# Patient Record
Sex: Female | Born: 1965 | Race: Asian | Hispanic: No | Marital: Married | Smoking: Never smoker
Health system: Southern US, Community
[De-identification: ages and names within clinical notes are randomized; demographics above are authoritative.]

## PROBLEM LIST (undated history)

## (undated) DIAGNOSIS — Z124 Encounter for screening for malignant neoplasm of cervix: Secondary | ICD-10-CM

## (undated) DIAGNOSIS — B019 Varicella without complication: Secondary | ICD-10-CM

## (undated) DIAGNOSIS — E782 Mixed hyperlipidemia: Secondary | ICD-10-CM

## (undated) DIAGNOSIS — Z0000 Encounter for general adult medical examination without abnormal findings: Secondary | ICD-10-CM

## (undated) HISTORY — DX: Encounter for screening for malignant neoplasm of cervix: Z12.4

## (undated) HISTORY — DX: Mixed hyperlipidemia: E78.2

## (undated) HISTORY — DX: Encounter for general adult medical examination without abnormal findings: Z00.00

## (undated) HISTORY — DX: Varicella without complication: B01.9

---

## 2009-02-01 LAB — CONVERTED CEMR LAB

## 2010-09-12 ENCOUNTER — Ambulatory Visit: Payer: Self-pay | Admitting: Family Medicine

## 2010-09-12 DIAGNOSIS — L659 Nonscarring hair loss, unspecified: Secondary | ICD-10-CM | POA: Insufficient documentation

## 2010-09-12 DIAGNOSIS — L708 Other acne: Secondary | ICD-10-CM | POA: Insufficient documentation

## 2010-11-03 LAB — HM MAMMOGRAPHY

## 2010-12-03 NOTE — Assessment & Plan Note (Signed)
Summary: NEW PT EST // RS   Vital Signs:  Patient profile:   45 year old female Menstrual status:  regular LMP:     08/30/2010 Height:      60.25 inches (153.04 cm) Weight:      108 pounds (49.09 kg) BMI:     20.99 O2 Sat:      97 % on Room air Temp:     97.9 degrees F (36.61 degrees C) oral Pulse rate:   74 / minute BP sitting:   108 / 72  (left arm) Cuff size:   regular  Vitals Entered By: Josph Macho RMA (September 12, 2010 10:56 AM)  O2 Flow:  Room air CC: Establish new patient/ CF Is Patient Diabetic? No LMP (date): 08/30/2010     Menstrual Status regular Enter LMP: 08/30/2010 Last PAP Result historical   History of Present Illness: a 45 year old Asian female in today for a new patient appointment. She is generally in good health and has only a few concerns. One is some intermittent hair loss. She first noticed it several years ago but it resolved only to recur a couple months ago. She denies any rash, pruritus or scaling. It has episodes of increased hair loss. She also has a long history of acne has been seen by dermatology previously taken Retin-A which resolved acne but it has recurred. It is only on her face not throughout her body is cystic in nature and she would like to proceed with further treatment once again. She is denying recent illness, fevers, chills, chest pain, palpitations, shortness of breath, fevers, chills, GI or GU complaints. She has no joint concerns, vision or hearing concerns or any trouble with sleeping or further illness  Preventive Screening-Counseling & Management  Alcohol-Tobacco     Smoking Status: never  Caffeine-Diet-Exercise     Does Patient Exercise: yes      Drug Use:  no.    Current Medications (verified): 1)  None  Allergies (verified): No Known Drug Allergies  Past History:  Past Surgical History: Caesarean section x 2  Family History: Father: 34, A&W Mother: 37, A&W Siblings:  Brother: 70, A&W Sister: 32,  A&W MGM: deceased unknown cause MGF: deceased in mid 44s, old age  PGM: deceased unknown cause PGF: deceased in mid 11s, old age Children: Son: 37, A&W Son: 46, A&W  Social History: Occupation: Arts development officer Married Never Smoked Alcohol use-no Drug use-no Regular exercise-yes, walking No dietary restrictions Smoking Status:  never Occupation:  employed Drug Use:  no Does Patient Exercise:  yes  Review of Systems  The patient denies anorexia, fever, weight loss, weight gain, vision loss, decreased hearing, hoarseness, chest pain, syncope, dyspnea on exertion, peripheral edema, prolonged cough, headaches, hemoptysis, abdominal pain, melena, hematochezia, severe indigestion/heartburn, hematuria, incontinence, muscle weakness, suspicious skin lesions, transient blindness, difficulty walking, depression, unusual weight change, abnormal bleeding, and enlarged lymph nodes.    Physical Exam  General:  Well-developed,well-nourished,in no acute distress; alert,appropriate and cooperative throughout examination Head:  Normocephalic and atraumatic without obvious abnormalities. No apparent alopecia or balding. Eyes:  No corneal or conjunctival inflammation noted. EOMI. Perrla. Funduscopic exam benign, without hemorrhages, exudates or papilledema. Vision grossly normal. Ears:  External ear exam shows no significant lesions or deformities.  Otoscopic examination reveals clear canals, tympanic membranes are intact bilaterally without bulging, retraction, inflammation or discharge. Hearing is grossly normal bilaterally. Nose:  External nasal examination shows no deformity or inflammation. Nasal mucosa are pink and moist without lesions or exudates.  Mouth:  Oral mucosa and oropharynx without lesions or exudates.  Teeth in good repair. Neck:  No deformities, masses, or tenderness noted. Lungs:  Normal respiratory effort, chest expands symmetrically. Lungs are clear to auscultation, no crackles or  wheezes. Heart:  Normal rate and regular rhythm. S1 and S2 normal without gallop, murmur, click, rub or other extra sounds. Abdomen:  Bowel sounds positive,abdomen soft and non-tender without masses, organomegaly or hernias noted. Msk:  No deformity or scoliosis noted of thoracic or lumbar spine.   Pulses:  R and L carotid,radial,femoral,dorsalis pedis and posterior tibial pulses are full and equal bilaterally Extremities:  No clubbing, cyanosis, edema, or deformity noted with normal full range of motion of all joints.   Neurologic:  No cranial nerve deficits noted. Station and gait are normal. Plantar reflexes are down-going bilaterally. DTRs are symmetrical throughout. Sensory, motor and coordinative functions appear intact. Cervical Nodes:  No lymphadenopathy noted Psych:  Cognition and judgment appear intact. Alert and cooperative with normal attention span and concentration. No apparent delusions, illusions, hallucinations   Impression & Recommendations:  Problem # 1:  HAIR LOSS (ICD-704.00) Check a TSH and start fish oil and MVI with minerals  Problem # 2:  CYSTIC ACNE (ICD-706.1)  Her updated medication list for this problem includes:    Benzaclin 1-5 % Gel (Clindamycin phos-benzoyl perox) .Marland Kitchen... Apply to lesions topically two times a day or as tolerated And advised Cetaphil soap and Witch Hazel astringent if no results will refer back to Dermatology  Problem # 3:  Screening Cervical Cancer (ICD-V76.2) She believes it has been over a year since last pap and she has never done a mammogram will have her sign a release of records for old MD and return in 1-2 mn after fasting labs to review these, do breast exam and pap smear  Complete Medication List: 1)  Benzaclin 1-5 % Gel (Clindamycin phos-benzoyl perox) .... Apply to lesions topically two times a day or as tolerated 2)  Fish Oil Concentrate 1000 Mg Caps (Omega-3 fatty acids) .Marland Kitchen.. 1 cap daily 3)  Cetaphil Antibacterial 0.3 % Bar  (Triclosan) .... Wash face two times a day  Patient Instructions: 1)  Please schedule a follow-up appointment in 1 to 2  months for gyn exam. For labs code V70.0 2)  BMP prior to visit, ICD-9:  3)  Hepatic Panel prior to visit ICD-9:  4)  Lipid panel prior to visit ICD-9 :  5)  TSH prior to visit ICD-9 :  6)  CBC w/ Diff prior to visit ICD-9  7)  Sign release of records for Doctor in [place] when you find the contact infor 8)  For hair start 1 fish oil cap daily and a Multivitamin with trace minerals every other day 9)  For skin wash twice daily with Cetaphil Acne Soap, then cleanse area with Witch Hazel Astringent on cotton pad and apply Benzaclin as tolerated if too drying use as much as tolerated Prescriptions: BENZACLIN 1-5 % GEL (CLINDAMYCIN PHOS-BENZOYL PEROX) apply to lesions topically two times a day or as tolerated  #35gm x 2   Entered and Authorized by:   Danise Edge MD   Signed by:   Danise Edge MD on 09/12/2010   Method used:   Print then Give to Patient   RxID:   [id number]    Orders Added: 1)  New Patient Level III [14782]    Preventive Care Screening  Pap Smear:    Date:  02/01/2009  Results:  historical

## 2011-01-03 ENCOUNTER — Telehealth (INDEPENDENT_AMBULATORY_CARE_PROVIDER_SITE_OTHER): Payer: Self-pay | Admitting: *Deleted

## 2011-01-09 NOTE — Progress Notes (Signed)
Summary: Lab work  Phone Note Call from Patient Call back at [phone]   Caller: Patient Call For: Danise Edge MD Summary of Call: Patient had office visit in November 2011 now she is wanting lab work done. Initial call taken by: Georga Bora,  January 03, 2011 11:06 AM  Follow-up for Phone Call        OK to order annual labs flp, tsh, cbc, renal and hepatic and then she can come in to review them, thanks Follow-up by: Danise Edge MD,  January 03, 2011 11:44 AM  Additional Follow-up for Phone Call Additional follow up Details #1::        Pt wants [place] to call her when lab is available at [facility]. Will put a copy of this in file to return call. Additional Follow-up by: Josph Macho RMA,  January 03, 2011 2:01 PM

## 2011-08-06 ENCOUNTER — Other Ambulatory Visit: Payer: Self-pay | Admitting: Family Medicine

## 2011-08-06 DIAGNOSIS — Z1231 Encounter for screening mammogram for malignant neoplasm of breast: Secondary | ICD-10-CM

## 2011-08-25 ENCOUNTER — Ambulatory Visit
Admission: RE | Admit: 2011-08-25 | Discharge: 2011-08-25 | Disposition: A | Discharge status: Home / Self Care | Payer: BC Managed Care – PPO | Source: Ambulatory Visit | Attending: Family Medicine | Admitting: Family Medicine

## 2011-08-25 DIAGNOSIS — Z1231 Encounter for screening mammogram for malignant neoplasm of breast: Secondary | ICD-10-CM

## 2012-03-31 ENCOUNTER — Other Ambulatory Visit (HOSPITAL_COMMUNITY)
Admission: RE | Admit: 2012-03-31 | Discharge: 2012-03-31 | Disposition: A | Discharge status: Home / Self Care | Payer: BC Managed Care – PPO | Source: Ambulatory Visit | Attending: Family Medicine | Admitting: Family Medicine

## 2012-03-31 ENCOUNTER — Ambulatory Visit (INDEPENDENT_AMBULATORY_CARE_PROVIDER_SITE_OTHER): Payer: BC Managed Care – PPO | Admitting: Family Medicine

## 2012-03-31 ENCOUNTER — Encounter: Payer: Self-pay | Admitting: Family Medicine

## 2012-03-31 DIAGNOSIS — L659 Nonscarring hair loss, unspecified: Secondary | ICD-10-CM

## 2012-03-31 DIAGNOSIS — Z01419 Encounter for gynecological examination (general) (routine) without abnormal findings: Secondary | ICD-10-CM | POA: Insufficient documentation

## 2012-03-31 DIAGNOSIS — L708 Other acne: Secondary | ICD-10-CM

## 2012-03-31 DIAGNOSIS — Z124 Encounter for screening for malignant neoplasm of cervix: Secondary | ICD-10-CM

## 2012-03-31 DIAGNOSIS — Z0000 Encounter for general adult medical examination without abnormal findings: Secondary | ICD-10-CM

## 2012-03-31 HISTORY — DX: Encounter for general adult medical examination without abnormal findings: Z00.00

## 2012-03-31 HISTORY — DX: Encounter for screening for malignant neoplasm of cervix: Z12.4

## 2012-03-31 LAB — RENAL FUNCTION PANEL
Calcium: 8.9 mg/dL (ref 8.4–10.5)
Creatinine, Ser: 0.6 mg/dL (ref 0.4–1.2)
GFR: 122.66 mL/min (ref >60.00)
Glucose, Bld: 71 mg/dL (ref 70–99)
Potassium: 3.4 mEq/L — ABNORMAL LOW (ref 3.5–5.1)
Sodium: 140 mEq/L (ref 135–145)

## 2012-03-31 LAB — LIPID PANEL
Cholesterol: 192 mg/dL (ref 0–200)
HDL: 68.5 mg/dL (ref >39.00)
LDL Cholesterol: 112 mg/dL — ABNORMAL HIGH (ref 0–99)
Total CHOL/HDL Ratio: 3
Triglycerides: 57 mg/dL (ref 0.0–149.0)
VLDL: 11.4 mg/dL (ref 0.0–40.0)

## 2012-03-31 LAB — CBC
HCT: 42.1 % (ref 36.0–46.0)
MCV: 91.5 fl (ref 78.0–100.0)
RBC: 4.6 Mil/uL (ref 3.87–5.11)
RDW: 13.8 % (ref 11.5–14.6)
WBC: 7.7 10*3/uL (ref 4.5–10.5)

## 2012-03-31 LAB — HEPATIC FUNCTION PANEL
ALT: 14 U/L (ref 0–35)
Bilirubin, Direct: 0 mg/dL (ref 0.0–0.3)
Total Bilirubin: 0.5 mg/dL (ref 0.3–1.2)

## 2012-03-31 LAB — TSH: TSH: 1.54 u[IU]/mL (ref 0.35–5.50)

## 2012-03-31 NOTE — Assessment & Plan Note (Signed)
Encouraged her to continue a Biotin supplement and to add  MegaRed cap daily. Will check a TSH

## 2012-03-31 NOTE — Progress Notes (Signed)
Patient ID: Signa Cheek, female   DOB: 06/20/1966, 46 y.o.   MRN: [id number] Lincy Belles [id number] October 06, 1966 03/31/2012      Progress Note New Patient  Subjective  Chief Complaint  Chief Complaint  Patient presents with  . Annual Exam    hasn't been seen since 09-12-10    HPI  Patient is a 46 year old Asian female in today for annual exam. She was seen once 2 years ago and has been doing very well so has not been since. She denies any recent illness. She denies any chest pain, palpitations, shortness of breath, GI or GU complaints. Her only complaint today really is hair loss. She's has been going on for years. She takes biotin infrequently. She denies any itching or scaly scalp. She denies any other concerns at today's visit. She struggled with cystic acne while she lived in [place] and even used Accutane without benefit. Since moving to [place] her skin has cleared up and she offers no complaints  Past Medical History  Diagnosis Date  . Chicken pox as a child  . Cervical cancer screening 03/31/2012  . Preventative health care 03/31/2012    Past Surgical History  Procedure Date  . Cesarean section 1995 and 1997    X 2    History reviewed. No pertinent family history.  History   Social History  . Marital Status: Married    Spouse Name: N/A    Number of Children: N/A  . Years of Education: N/A   Occupational History  . Not on file.   Social History Main Topics  . Smoking status: Never Smoker   . Smokeless tobacco: Never Used  . Alcohol Use: No  . Drug Use: No  . Sexually Active: Not Currently   Other Topics Concern  . Not on file   Social History Narrative  . No narrative on file    No current outpatient prescriptions on file prior to visit.    No Known Allergies  Review of Systems  Review of Systems  Constitutional: Negative for fever, chills and malaise/fatigue.  HENT: Negative for hearing loss, nosebleeds and congestion.   Eyes: Negative for  discharge.  Respiratory: Negative for cough, sputum production, shortness of breath and wheezing.   Cardiovascular: Negative for chest pain, palpitations and leg swelling.  Gastrointestinal: Negative for heartburn, nausea, vomiting, abdominal pain, diarrhea, constipation and blood in stool.  Genitourinary: Negative for dysuria, urgency, frequency and hematuria.  Musculoskeletal: Negative for myalgias, back pain and falls.  Skin: Negative for rash.  Neurological: Negative for dizziness, tremors, sensory change, focal weakness, loss of consciousness, weakness and headaches.  Endo/Heme/Allergies: Negative for polydipsia. Does not bruise/bleed easily.  Psychiatric/Behavioral: Negative for depression and suicidal ideas. The patient is not nervous/anxious and does not have insomnia.     Objective  BP 105/71  Pulse 68  Temp(Src) 99.8 F (37.7 C) (Temporal)  Ht 5' 0.25" (1.53 m)  Wt 111 lb 12.8 oz (50.712 kg)  BMI 21.65 kg/m2  SpO2 98%  LMP 03/12/2012  Physical Exam  Physical Exam  Constitutional: She is oriented to person, place, and time and well-developed, well-nourished, and in no distress. No distress.  HENT:  Head: Normocephalic and atraumatic.  Right Ear: External ear normal.  Left Ear: External ear normal.  Nose: Nose normal.  Mouth/Throat: Oropharynx is clear and moist. No oropharyngeal exudate.  Eyes: Conjunctivae are normal. Pupils are equal, round, and reactive to light. Right eye exhibits no discharge. Left eye exhibits no discharge. No scleral  icterus.  Neck: Normal range of motion. Neck supple. No thyromegaly present.  Cardiovascular: Normal rate, regular rhythm, normal heart sounds and intact distal pulses.   No murmur heard. Pulmonary/Chest: Effort normal and breath sounds normal. No respiratory distress. She has no wheezes. She has no rales.  Abdominal: Soft. Bowel sounds are normal. She exhibits no distension and no mass. There is no tenderness.  Genitourinary:  Vagina normal, uterus normal, cervix normal, right adnexa normal and left adnexa normal. No vaginal discharge found.       Breast exam normal, no discharge, lesions, skin changes or discharge  Musculoskeletal: Normal range of motion. She exhibits no edema and no tenderness.  Lymphadenopathy:    She has no cervical adenopathy.  Neurological: She is alert and oriented to person, place, and time. She has normal reflexes. No cranial nerve deficit. Coordination normal.  Skin: Skin is warm and dry. No rash noted. She is not diaphoretic.  Psychiatric: Mood, memory and affect normal.       Assessment & Plan  Cervical cancer screening Pap taken today, no concerns on exam. Repeat every 2-3 years or as needed. Encouraged MGM every 1-2 years.   HAIR LOSS Encouraged her to continue a Biotin supplement and to add  MegaRed cap daily. Will check a TSH   CYSTIC ACNE Previously has used products as strong as Accutane while living in [place]. Since moving to [place] she has not used any products and her skin has cleared up nicely  Preventative health care Encouraged booster of Tdap. She believes she last had a Td about 10 years ago. Declines for today but will notify us if she wishes to proceed. Discussed flu shots but so far she does not take them and chooses not to. She may call and come in in fall if she changes her mind. Encouraged regular seat belt use. Check fasting labs today. She does not believe she has done any lab work for roughly 5 years

## 2012-03-31 NOTE — Assessment & Plan Note (Addendum)
Encouraged booster of Tdap. She believes she last had a Td about 10 years ago. Declines for today but will notify us if she wishes to proceed. Discussed flu shots but so far she does not take them and chooses not to. She may call and come in in fall if she changes her mind. Encouraged regular seat belt use. Check fasting labs today. She does not believe she has done any lab work for roughly 5 years

## 2012-03-31 NOTE — Patient Instructions (Signed)
Preventive Care for Adults, Female A healthy lifestyle and preventive care can promote health and wellness. Preventive health guidelines for women include the following key practices.  A routine yearly physical is a good way to check with your caregiver about your health and preventive screening. It is a chance to share any concerns and updates on your health, and to receive a thorough exam.   Visit your dentist for a routine exam and preventive care every 6 months. Brush your teeth twice a day and floss once a day. Good oral hygiene prevents tooth decay and gum disease.   The frequency of eye exams is based on your age, health, family medical history, use of contact lenses, and other factors. Follow your caregiver's recommendations for frequency of eye exams.   Eat a healthy diet. Foods like vegetables, fruits, whole grains, low-fat dairy products, and lean protein foods contain the nutrients you need without too many calories. Decrease your intake of foods high in solid fats, added sugars, and salt. Eat the right amount of calories for you.Get information about a proper diet from your caregiver, if necessary.   Regular physical exercise is one of the most important things you can do for your health. Most adults should get at least 150 minutes of moderate-intensity exercise (any activity that increases your heart rate and causes you to sweat) each week. In addition, most adults need muscle-strengthening exercises on 2 or more days a week.   Maintain a healthy weight. The body mass index (BMI) is a screening tool to identify possible weight problems. It provides an estimate of body fat based on height and weight. Your caregiver can help determine your BMI, and can help you achieve or maintain a healthy weight.For adults 20 years and older:   A BMI below 18.5 is considered underweight.   A BMI of 18.5 to 24.9 is normal.   A BMI of 25 to 29.9 is considered overweight.   A BMI of 30 and above is  considered obese.   Maintain normal blood lipids and cholesterol levels by exercising and minimizing your intake of saturated fat. Eat a balanced diet with plenty of fruit and vegetables. Blood tests for lipids and cholesterol should begin at age 20 and be repeated every 5 years. If your lipid or cholesterol levels are high, you are over 50, or you are at high risk for heart disease, you may need your cholesterol levels checked more frequently.Ongoing high lipid and cholesterol levels should be treated with medicines if diet and exercise are not effective.   If you smoke, find out from your caregiver how to quit. If you do not use tobacco, do not start.   If you are pregnant, do not drink alcohol. If you are breastfeeding, be very cautious about drinking alcohol. If you are not pregnant and choose to drink alcohol, do not exceed 1 drink per day. One drink is considered to be 12 ounces (355 mL) of beer, 5 ounces (148 mL) of wine, or 1.5 ounces (44 mL) of liquor.   Avoid use of street drugs. Do not share needles with anyone. Ask for help if you need support or instructions about stopping the use of drugs.   High blood pressure causes heart disease and increases the risk of stroke. Your blood pressure should be checked at least every 1 to 2 years. Ongoing high blood pressure should be treated with medicines if weight loss and exercise are not effective.   If you are 55 to 44   years old, ask your caregiver if you should take aspirin to prevent strokes.   Diabetes screening involves taking a blood sample to check your fasting blood sugar level. This should be done once every 3 years, after age 45, if you are within normal weight and without risk factors for diabetes. Testing should be considered at a younger age or be carried out more frequently if you are overweight and have at least 1 risk factor for diabetes.   Breast cancer screening is essential preventive care for women. You should practice "breast  self-awareness." This means understanding the normal appearance and feel of your breasts and may include breast self-examination. Any changes detected, no matter how small, should be reported to a caregiver. Women in their 20s and 30s should have a clinical breast exam (CBE) by a caregiver as part of a regular health exam every 1 to 3 years. After age 40, women should have a CBE every year. Starting at age 40, women should consider having a mammography (breast X-ray test) every year. Women who have a family history of breast cancer should talk to their caregiver about genetic screening. Women at a high risk of breast cancer should talk to their caregivers about having magnetic resonance imaging (MRI) and a mammography every year.   The Pap test is a screening test for cervical cancer. A Pap test can show cell changes on the cervix that might become cervical cancer if left untreated. A Pap test is a procedure in which cells are obtained and examined from the lower end of the uterus (cervix).   Women should have a Pap test starting at age 21.   Between ages 21 and 29, Pap tests should be repeated every 2 years.   Beginning at age 30, you should have a Pap test every 3 years as long as the past 3 Pap tests have been normal.   Some women have medical problems that increase the chance of getting cervical cancer. Talk to your caregiver about these problems. It is especially important to talk to your caregiver if a new problem develops soon after your last Pap test. In these cases, your caregiver may recommend more frequent screening and Pap tests.   The above recommendations are the same for women who have or have not gotten the vaccine for human papillomavirus (HPV).   If you had a hysterectomy for a problem that was not cancer or a condition that could lead to cancer, then you no longer need Pap tests. Even if you no longer need a Pap test, a regular exam is a good idea to make sure no other problems are  starting.   If you are between ages 65 and 70, and you have had normal Pap tests going back 10 years, you no longer need Pap tests. Even if you no longer need a Pap test, a regular exam is a good idea to make sure no other problems are starting.   If you have had past treatment for cervical cancer or a condition that could lead to cancer, you need Pap tests and screening for cancer for at least 20 years after your treatment.   If Pap tests have been discontinued, risk factors (such as a new sexual partner) need to be reassessed to determine if screening should be resumed.   The HPV test is an additional test that may be used for cervical cancer screening. The HPV test looks for the virus that can cause the cell changes on the cervix.   The cells collected during the Pap test can be tested for HPV. The HPV test could be used to screen women aged 30 years and older, and should be used in women of any age who have unclear Pap test results. After the age of 30, women should have HPV testing at the same frequency as a Pap test.   Colorectal cancer can be detected and often prevented. Most routine colorectal cancer screening begins at the age of 50 and continues through age 75. However, your caregiver may recommend screening at an earlier age if you have risk factors for colon cancer. On a yearly basis, your caregiver may provide home test kits to check for hidden blood in the stool. Use of a small camera at the end of a tube, to directly examine the colon (sigmoidoscopy or colonoscopy), can detect the earliest forms of colorectal cancer. Talk to your caregiver about this at age 50, when routine screening begins. Direct examination of the colon should be repeated every 5 to 10 years through age 75, unless early forms of pre-cancerous polyps or small growths are found.   Hepatitis C blood testing is recommended for all people born from 1945 through 1965 and any individual with known risks for hepatitis C.    Practice safe sex. Use condoms and avoid high-risk sexual practices to reduce the spread of sexually transmitted infections (STIs). STIs include gonorrhea, chlamydia, syphilis, trichomonas, herpes, HPV, and human immunodeficiency virus (HIV). Herpes, HIV, and HPV are viral illnesses that have no cure. They can result in disability, cancer, and death. Sexually active women aged 25 and younger should be checked for chlamydia. Older women with new or multiple partners should also be tested for chlamydia. Testing for other STIs is recommended if you are sexually active and at increased risk.   Osteoporosis is a disease in which the bones lose minerals and strength with aging. This can result in serious bone fractures. The risk of osteoporosis can be identified using a bone density scan. Women ages 65 and over and women at risk for fractures or osteoporosis should discuss screening with their caregivers. Ask your caregiver whether you should take a calcium supplement or vitamin D to reduce the rate of osteoporosis.   Menopause can be associated with physical symptoms and risks. Hormone replacement therapy is available to decrease symptoms and risks. You should talk to your caregiver about whether hormone replacement therapy is right for you.   Use sunscreen with sun protection factor (SPF) of 30 or more. Apply sunscreen liberally and repeatedly throughout the day. You should seek shade when your shadow is shorter than you. Protect yourself by wearing long sleeves, pants, a wide-brimmed hat, and sunglasses year round, whenever you are outdoors.   Once a month, do a whole body skin exam, using a mirror to look at the skin on your back. Notify your caregiver of new moles, moles that have irregular borders, moles that are larger than a pencil eraser, or moles that have changed in shape or color.   Stay current with required immunizations.   Influenza. You need a dose every fall (or winter). The composition of  the flu vaccine changes each year, so being vaccinated once is not enough.   Pneumococcal polysaccharide. You need 1 to 2 doses if you smoke cigarettes or if you have certain chronic medical conditions. You need 1 dose at age 65 (or older) if you have never been vaccinated.   Tetanus, diphtheria, pertussis (Tdap, Td). Get 1 dose of   Tdap vaccine if you are younger than age 65, are over 65 and have contact with an infant, are a healthcare worker, are pregnant, or simply want to be protected from whooping cough. After that, you need a Td booster dose every 10 years. Consult your caregiver if you have not had at least 3 tetanus and diphtheria-containing shots sometime in your life or have a deep or dirty wound.   HPV. You need this vaccine if you are a woman age 26 or younger. The vaccine is given in 3 doses over 6 months.   Measles, mumps, rubella (MMR). You need at least 1 dose of MMR if you were born in 1957 or later. You may also need a second dose.   Meningococcal. If you are age 19 to 21 and a first-year college student living in a residence hall, or have one of several medical conditions, you need to get vaccinated against meningococcal disease. You may also need additional booster doses.   Zoster (shingles). If you are age 60 or older, you should get this vaccine.   Varicella (chickenpox). If you have never had chickenpox or you were vaccinated but received only 1 dose, talk to your caregiver to find out if you need this vaccine.   Hepatitis A. You need this vaccine if you have a specific risk factor for hepatitis A virus infection or you simply wish to be protected from this disease. The vaccine is usually given as 2 doses, 6 to 18 months apart.   Hepatitis B. You need this vaccine if you have a specific risk factor for hepatitis B virus infection or you simply wish to be protected from this disease. The vaccine is given in 3 doses, usually over 6 months.  Preventive Services /  Frequency Ages 19 to 39  Blood pressure check.** / Every 1 to 2 years.   Lipid and cholesterol check.** / Every 5 years beginning at age 20.   Clinical breast exam.** / Every 3 years for women in their 20s and 30s.   Pap test.** / Every 2 years from ages 21 through 29. Every 3 years starting at age 30 through age 65 or 70 with a history of 3 consecutive normal Pap tests.   HPV screening.** / Every 3 years from ages 30 through ages 65 to 70 with a history of 3 consecutive normal Pap tests.   Hepatitis C blood test.** / For any individual with known risks for hepatitis C.   Skin self-exam. / Monthly.   Influenza immunization.** / Every year.   Pneumococcal polysaccharide immunization.** / 1 to 2 doses if you smoke cigarettes or if you have certain chronic medical conditions.   Tetanus, diphtheria, pertussis (Tdap, Td) immunization. / A one-time dose of Tdap vaccine. After that, you need a Td booster dose every 10 years.   HPV immunization. / 3 doses over 6 months, if you are 26 and younger.   Measles, mumps, rubella (MMR) immunization. / You need at least 1 dose of MMR if you were born in 1957 or later. You may also need a second dose.   Meningococcal immunization. / 1 dose if you are age 19 to 21 and a first-year college student living in a residence hall, or have one of several medical conditions, you need to get vaccinated against meningococcal disease. You may also need additional booster doses.   Varicella immunization.** / Consult your caregiver.   Hepatitis A immunization.** / Consult your caregiver. 2 doses, 6 to 18 months   apart.   Hepatitis B immunization.** / Consult your caregiver. 3 doses usually over 6 months.  Ages 40 to 64  Blood pressure check.** / Every 1 to 2 years.   Lipid and cholesterol check.** / Every 5 years beginning at age 20.   Clinical breast exam.** / Every year after age 40.   Mammogram.** / Every year beginning at age 40 and continuing for as  long as you are in good health. Consult with your caregiver.   Pap test.** / Every 3 years starting at age 30 through age 65 or 70 with a history of 3 consecutive normal Pap tests.   HPV screening.** / Every 3 years from ages 30 through ages 65 to 70 with a history of 3 consecutive normal Pap tests.   Fecal occult blood test (FOBT) of stool. / Every year beginning at age 50 and continuing until age 75. You may not need to do this test if you get a colonoscopy every 10 years.   Flexible sigmoidoscopy or colonoscopy.** / Every 5 years for a flexible sigmoidoscopy or every 10 years for a colonoscopy beginning at age 50 and continuing until age 75.   Hepatitis C blood test.** / For all people born from 1945 through 1965 and any individual with known risks for hepatitis C.   Skin self-exam. / Monthly.   Influenza immunization.** / Every year.   Pneumococcal polysaccharide immunization.** / 1 to 2 doses if you smoke cigarettes or if you have certain chronic medical conditions.   Tetanus, diphtheria, pertussis (Tdap, Td) immunization.** / A one-time dose of Tdap vaccine. After that, you need a Td booster dose every 10 years.   Measles, mumps, rubella (MMR) immunization. / You need at least 1 dose of MMR if you were born in 1957 or later. You may also need a second dose.   Varicella immunization.** / Consult your caregiver.   Meningococcal immunization.** / Consult your caregiver.   Hepatitis A immunization.** / Consult your caregiver. 2 doses, 6 to 18 months apart.   Hepatitis B immunization.** / Consult your caregiver. 3 doses, usually over 6 months.  Ages 65 and over  Blood pressure check.** / Every 1 to 2 years.   Lipid and cholesterol check.** / Every 5 years beginning at age 20.   Clinical breast exam.** / Every year after age 40.   Mammogram.** / Every year beginning at age 40 and continuing for as long as you are in good health. Consult with your caregiver.   Pap test.** /  Every 3 years starting at age 30 through age 65 or 70 with a 3 consecutive normal Pap tests. Testing can be stopped between 65 and 70 with 3 consecutive normal Pap tests and no abnormal Pap or HPV tests in the past 10 years.   HPV screening.** / Every 3 years from ages 30 through ages 65 or 70 with a history of 3 consecutive normal Pap tests. Testing can be stopped between 65 and 70 with 3 consecutive normal Pap tests and no abnormal Pap or HPV tests in the past 10 years.   Fecal occult blood test (FOBT) of stool. / Every year beginning at age 50 and continuing until age 75. You may not need to do this test if you get a colonoscopy every 10 years.   Flexible sigmoidoscopy or colonoscopy.** / Every 5 years for a flexible sigmoidoscopy or every 10 years for a colonoscopy beginning at age 50 and continuing until age 75.   Hepatitis   C blood test.** / For all people born from 1945 through 1965 and any individual with known risks for hepatitis C.   Osteoporosis screening.** / A one-time screening for women ages 65 and over and women at risk for fractures or osteoporosis.   Skin self-exam. / Monthly.   Influenza immunization.** / Every year.   Pneumococcal polysaccharide immunization.** / 1 dose at age 65 (or older) if you have never been vaccinated.   Tetanus, diphtheria, pertussis (Tdap, Td) immunization. / A one-time dose of Tdap vaccine if you are over 65 and have contact with an infant, are a healthcare worker, or simply want to be protected from whooping cough. After that, you need a Td booster dose every 10 years.   Varicella immunization.** / Consult your caregiver.   Meningococcal immunization.** / Consult your caregiver.   Hepatitis A immunization.** / Consult your caregiver. 2 doses, 6 to 18 months apart.   Hepatitis B immunization.** / Check with your caregiver. 3 doses, usually over 6 months.  ** Family history and personal history of risk and conditions may change your caregiver's  recommendations. Document Released: 12/16/2001 Document Revised: 10/09/2011 Document Reviewed: 03/17/2011 [employer] Patient Information 2012 [employer]. 

## 2012-03-31 NOTE — Assessment & Plan Note (Addendum)
Pap taken today, no concerns on exam. Repeat every 2-3 years or as needed. Encouraged MGM every 1-2 years.

## 2012-03-31 NOTE — Assessment & Plan Note (Signed)
Previously has used products as strong as Accutane while living in [place]. Since moving to [place] she has not used any products and her skin has cleared up nicely

## 2015-04-04 ENCOUNTER — Telehealth: Payer: Self-pay | Admitting: Family Medicine

## 2015-04-04 NOTE — Telephone Encounter (Signed)
Pre Visit letter sent  °

## 2015-04-24 ENCOUNTER — Ambulatory Visit (INDEPENDENT_AMBULATORY_CARE_PROVIDER_SITE_OTHER): Payer: 59 | Admitting: Family Medicine

## 2015-04-24 ENCOUNTER — Encounter: Payer: Self-pay | Admitting: Family Medicine

## 2015-04-24 ENCOUNTER — Other Ambulatory Visit (HOSPITAL_COMMUNITY)
Admission: RE | Admit: 2015-04-24 | Discharge: 2015-04-24 | Disposition: A | Discharge status: Home / Self Care | Payer: 59 | Source: Ambulatory Visit | Attending: Family Medicine | Admitting: Family Medicine

## 2015-04-24 VITALS — BP 102/68 | HR 73 | Temp 98.7°F | Ht 60.0 in | Wt 107.1 lb

## 2015-04-24 DIAGNOSIS — E876 Hypokalemia: Secondary | ICD-10-CM

## 2015-04-24 DIAGNOSIS — Z1239 Encounter for other screening for malignant neoplasm of breast: Secondary | ICD-10-CM

## 2015-04-24 DIAGNOSIS — Z124 Encounter for screening for malignant neoplasm of cervix: Secondary | ICD-10-CM

## 2015-04-24 DIAGNOSIS — Z01419 Encounter for gynecological examination (general) (routine) without abnormal findings: Secondary | ICD-10-CM | POA: Diagnosis not present

## 2015-04-24 DIAGNOSIS — E782 Mixed hyperlipidemia: Secondary | ICD-10-CM | POA: Diagnosis not present

## 2015-04-24 DIAGNOSIS — Z0000 Encounter for general adult medical examination without abnormal findings: Secondary | ICD-10-CM

## 2015-04-24 NOTE — Assessment & Plan Note (Signed)
Patient encouraged to maintain heart healthy diet, regular exercise, adequate sleep. Consider daily probiotics. Take medications as prescribed. Given and reviewed copy of ACP documents from [place] Secretary of State and encouraged to complete and return 

## 2015-04-24 NOTE — Patient Instructions (Signed)
Preventive Care for Adults A healthy lifestyle and preventive care can promote health and wellness. Preventive health guidelines for women include the following key practices.  A routine yearly physical is a good way to check with your health care provider about your health and preventive screening. It is a chance to share any concerns and updates on your health and to receive a thorough exam.  Visit your dentist for a routine exam and preventive care every 6 months. Brush your teeth twice a day and floss once a day. Good oral hygiene prevents tooth decay and gum disease.  The frequency of eye exams is based on your age, health, family medical history, use of contact lenses, and other factors. Follow your health care provider's recommendations for frequency of eye exams.  Eat a healthy diet. Foods like vegetables, fruits, whole grains, low-fat dairy products, and lean protein foods contain the nutrients you need without too many calories. Decrease your intake of foods high in solid fats, added sugars, and salt. Eat the right amount of calories for you.Get information about a proper diet from your health care provider, if necessary.  Regular physical exercise is one of the most important things you can do for your health. Most adults should get at least 150 minutes of moderate-intensity exercise (any activity that increases your heart rate and causes you to sweat) each week. In addition, most adults need muscle-strengthening exercises on 2 or more days a week.  Maintain a healthy weight. The body mass index (BMI) is a screening tool to identify possible weight problems. It provides an estimate of body fat based on height and weight. Your health care provider can find your BMI and can help you achieve or maintain a healthy weight.For adults 20 years and older:  A BMI below 18.5 is considered underweight.  A BMI of 18.5 to 24.9 is normal.  A BMI of 25 to 29.9 is considered overweight.  A BMI of  30 and above is considered obese.  Maintain normal blood lipids and cholesterol levels by exercising and minimizing your intake of saturated fat. Eat a balanced diet with plenty of fruit and vegetables. Blood tests for lipids and cholesterol should begin at age 76 and be repeated every 5 years. If your lipid or cholesterol levels are high, you are over 50, or you are at high risk for heart disease, you may need your cholesterol levels checked more frequently.Ongoing high lipid and cholesterol levels should be treated with medicines if diet and exercise are not working.  If you smoke, find out from your health care provider how to quit. If you do not use tobacco, do not start.  Lung cancer screening is recommended for adults aged 22-80 years who are at high risk for developing lung cancer because of a history of smoking. A yearly low-dose CT scan of the lungs is recommended for people who have at least a 30-pack-year history of smoking and are a current smoker or have quit within the past 15 years. A pack year of smoking is smoking an average of 1 pack of cigarettes a day for 1 year (for example: 1 pack a day for 30 years or 2 packs a day for 15 years). Yearly screening should continue until the smoker has stopped smoking for at least 15 years. Yearly screening should be stopped for people who develop a health problem that would prevent them from having lung cancer treatment.  If you are pregnant, do not drink alcohol. If you are breastfeeding,  be very cautious about drinking alcohol. If you are not pregnant and choose to drink alcohol, do not have more than 1 drink per day. One drink is considered to be 12 ounces (355 mL) of beer, 5 ounces (148 mL) of wine, or 1.5 ounces (44 mL) of liquor.  Avoid use of street drugs. Do not share needles with anyone. Ask for help if you need support or instructions about stopping the use of drugs.  High blood pressure causes heart disease and increases the risk of  stroke. Your blood pressure should be checked at least every 1 to 2 years. Ongoing high blood pressure should be treated with medicines if weight loss and exercise do not work.  If you are 3-86 years old, ask your health care provider if you should take aspirin to prevent strokes.  Diabetes screening involves taking a blood sample to check your fasting blood sugar level. This should be done once every 3 years, after age 67, if you are within normal weight and without risk factors for diabetes. Testing should be considered at a younger age or be carried out more frequently if you are overweight and have at least 1 risk factor for diabetes.  Breast cancer screening is essential preventive care for women. You should practice "breast self-awareness." This means understanding the normal appearance and feel of your breasts and may include breast self-examination. Any changes detected, no matter how small, should be reported to a health care provider. Women in their 8s and 30s should have a clinical breast exam (CBE) by a health care provider as part of a regular health exam every 1 to 3 years. After age 70, women should have a CBE every year. Starting at age 25, women should consider having a mammogram (breast X-ray test) every year. Women who have a family history of breast cancer should talk to their health care provider about genetic screening. Women at a high risk of breast cancer should talk to their health care providers about having an MRI and a mammogram every year.  Breast cancer gene (BRCA)-related cancer risk assessment is recommended for women who have family members with BRCA-related cancers. BRCA-related cancers include breast, ovarian, tubal, and peritoneal cancers. Having family members with these cancers may be associated with an increased risk for harmful changes (mutations) in the breast cancer genes BRCA1 and BRCA2. Results of the assessment will determine the need for genetic counseling and  BRCA1 and BRCA2 testing.  Routine pelvic exams to screen for cancer are no longer recommended for nonpregnant women who are considered low risk for cancer of the pelvic organs (ovaries, uterus, and vagina) and who do not have symptoms. Ask your health care provider if a screening pelvic exam is right for you.  If you have had past treatment for cervical cancer or a condition that could lead to cancer, you need Pap tests and screening for cancer for at least 20 years after your treatment. If Pap tests have been discontinued, your risk factors (such as having a new sexual partner) need to be reassessed to determine if screening should be resumed. Some women have medical problems that increase the chance of getting cervical cancer. In these cases, your health care provider may recommend more frequent screening and Pap tests.  The HPV test is an additional test that may be used for cervical cancer screening. The HPV test looks for the virus that can cause the cell changes on the cervix. The cells collected during the Pap test can be  tested for HPV. The HPV test could be used to screen women aged 30 years and older, and should be used in women of any age who have unclear Pap test results. After the age of 30, women should have HPV testing at the same frequency as a Pap test.  Colorectal cancer can be detected and often prevented. Most routine colorectal cancer screening begins at the age of 50 years and continues through age 75 years. However, your health care provider may recommend screening at an earlier age if you have risk factors for colon cancer. On a yearly basis, your health care provider may provide home test kits to check for hidden blood in the stool. Use of a small camera at the end of a tube, to directly examine the colon (sigmoidoscopy or colonoscopy), can detect the earliest forms of colorectal cancer. Talk to your health care provider about this at age 50, when routine screening begins. Direct  exam of the colon should be repeated every 5-10 years through age 75 years, unless early forms of pre-cancerous polyps or small growths are found.  People who are at an increased risk for hepatitis B should be screened for this virus. You are considered at high risk for hepatitis B if:  You were born in a country where hepatitis B occurs often. Talk with your health care provider about which countries are considered high risk.  Your parents were born in a high-risk country and you have not received a shot to protect against hepatitis B (hepatitis B vaccine).  You have HIV or AIDS.  You use needles to inject street drugs.  You live with, or have sex with, someone who has hepatitis B.  You get hemodialysis treatment.  You take certain medicines for conditions like cancer, organ transplantation, and autoimmune conditions.  Hepatitis C blood testing is recommended for all people born from 1945 through 1965 and any individual with known risks for hepatitis C.  Practice safe sex. Use condoms and avoid high-risk sexual practices to reduce the spread of sexually transmitted infections (STIs). STIs include gonorrhea, chlamydia, syphilis, trichomonas, herpes, HPV, and human immunodeficiency virus (HIV). Herpes, HIV, and HPV are viral illnesses that have no cure. They can result in disability, cancer, and death.  You should be screened for sexually transmitted illnesses (STIs) including gonorrhea and chlamydia if:  You are sexually active and are younger than 24 years.  You are older than 24 years and your health care provider tells you that you are at risk for this type of infection.  Your sexual activity has changed since you were last screened and you are at an increased risk for chlamydia or gonorrhea. Ask your health care provider if you are at risk.  If you are at risk of being infected with HIV, it is recommended that you take a prescription medicine daily to prevent HIV infection. This is  called preexposure prophylaxis (PrEP). You are considered at risk if:  You are a heterosexual woman, are sexually active, and are at increased risk for HIV infection.  You take drugs by injection.  You are sexually active with a partner who has HIV.  Talk with your health care provider about whether you are at high risk of being infected with HIV. If you choose to begin PrEP, you should first be tested for HIV. You should then be tested every 3 months for as long as you are taking PrEP.  Osteoporosis is a disease in which the bones lose minerals and strength   with aging. This can result in serious bone fractures or breaks. The risk of osteoporosis can be identified using a bone density scan. Women ages 65 years and over and women at risk for fractures or osteoporosis should discuss screening with their health care providers. Ask your health care provider whether you should take a calcium supplement or vitamin D to reduce the rate of osteoporosis.  Menopause can be associated with physical symptoms and risks. Hormone replacement therapy is available to decrease symptoms and risks. You should talk to your health care provider about whether hormone replacement therapy is right for you.  Use sunscreen. Apply sunscreen liberally and repeatedly throughout the day. You should seek shade when your shadow is shorter than you. Protect yourself by wearing long sleeves, pants, a wide-brimmed hat, and sunglasses year round, whenever you are outdoors.  Once a month, do a whole body skin exam, using a mirror to look at the skin on your back. Tell your health care provider of new moles, moles that have irregular borders, moles that are larger than a pencil eraser, or moles that have changed in shape or color.  Stay current with required vaccines (immunizations).  Influenza vaccine. All adults should be immunized every year.  Tetanus, diphtheria, and acellular pertussis (Td, Tdap) vaccine. Pregnant women should  receive 1 dose of Tdap vaccine during each pregnancy. The dose should be obtained regardless of the length of time since the last dose. Immunization is preferred during the 27th-36th week of gestation. An adult who has not previously received Tdap or who does not know her vaccine status should receive 1 dose of Tdap. This initial dose should be followed by tetanus and diphtheria toxoids (Td) booster doses every 10 years. Adults with an unknown or incomplete history of completing a 3-dose immunization series with Td-containing vaccines should begin or complete a primary immunization series including a Tdap dose. Adults should receive a Td booster every 10 years.  Varicella vaccine. An adult without evidence of immunity to varicella should receive 2 doses or a second dose if she has previously received 1 dose. Pregnant females who do not have evidence of immunity should receive the first dose after pregnancy. This first dose should be obtained before leaving the health care facility. The second dose should be obtained 4-8 weeks after the first dose.  Human papillomavirus (HPV) vaccine. Females aged 13-26 years who have not received the vaccine previously should obtain the 3-dose series. The vaccine is not recommended for use in pregnant females. However, pregnancy testing is not needed before receiving a dose. If a female is found to be pregnant after receiving a dose, no treatment is needed. In that case, the remaining doses should be delayed until after the pregnancy. Immunization is recommended for any person with an immunocompromised condition through the age of 26 years if she did not get any or all doses earlier. During the 3-dose series, the second dose should be obtained 4-8 weeks after the first dose. The third dose should be obtained 24 weeks after the first dose and 16 weeks after the second dose.  Zoster vaccine. One dose is recommended for adults aged 60 years or older unless certain conditions are  present.  Measles, mumps, and rubella (MMR) vaccine. Adults born before 1957 generally are considered immune to measles and mumps. Adults born in 1957 or later should have 1 or more doses of MMR vaccine unless there is a contraindication to the vaccine or there is laboratory evidence of immunity to   each of the three diseases. A routine second dose of MMR vaccine should be obtained at least 28 days after the first dose for students attending postsecondary schools, health care workers, or international travelers. People who received inactivated measles vaccine or an unknown type of measles vaccine during 1963-1967 should receive 2 doses of MMR vaccine. People who received inactivated mumps vaccine or an unknown type of mumps vaccine before 1979 and are at high risk for mumps infection should consider immunization with 2 doses of MMR vaccine. For females of childbearing age, rubella immunity should be determined. If there is no evidence of immunity, females who are not pregnant should be vaccinated. If there is no evidence of immunity, females who are pregnant should delay immunization until after pregnancy. Unvaccinated health care workers born before 1957 who lack laboratory evidence of measles, mumps, or rubella immunity or laboratory confirmation of disease should consider measles and mumps immunization with 2 doses of MMR vaccine or rubella immunization with 1 dose of MMR vaccine.  Pneumococcal 13-valent conjugate (PCV13) vaccine. When indicated, a person who is uncertain of her immunization history and has no record of immunization should receive the PCV13 vaccine. An adult aged 19 years or older who has certain medical conditions and has not been previously immunized should receive 1 dose of PCV13 vaccine. This PCV13 should be followed with a dose of pneumococcal polysaccharide (PPSV23) vaccine. The PPSV23 vaccine dose should be obtained at least 8 weeks after the dose of PCV13 vaccine. An adult aged 19  years or older who has certain medical conditions and previously received 1 or more doses of PPSV23 vaccine should receive 1 dose of PCV13. The PCV13 vaccine dose should be obtained 1 or more years after the last PPSV23 vaccine dose.  Pneumococcal polysaccharide (PPSV23) vaccine. When PCV13 is also indicated, PCV13 should be obtained first. All adults aged 65 years and older should be immunized. An adult younger than age 65 years who has certain medical conditions should be immunized. Any person who resides in a nursing home or long-term care facility should be immunized. An adult smoker should be immunized. People with an immunocompromised condition and certain other conditions should receive both PCV13 and PPSV23 vaccines. People with human immunodeficiency virus (HIV) infection should be immunized as soon as possible after diagnosis. Immunization during chemotherapy or radiation therapy should be avoided. Routine use of PPSV23 vaccine is not recommended for American Indians, [place] Natives, or people younger than 65 years unless there are medical conditions that require PPSV23 vaccine. When indicated, people who have unknown immunization and have no record of immunization should receive PPSV23 vaccine. One-time revaccination 5 years after the first dose of PPSV23 is recommended for people aged 19-64 years who have chronic kidney failure, nephrotic syndrome, asplenia, or immunocompromised conditions. People who received 1-2 doses of PPSV23 before age 65 years should receive another dose of PPSV23 vaccine at age 65 years or later if at least 5 years have passed since the previous dose. Doses of PPSV23 are not needed for people immunized with PPSV23 at or after age 65 years.  Meningococcal vaccine. Adults with asplenia or persistent complement component deficiencies should receive 2 doses of quadrivalent meningococcal conjugate (MenACWY-D) vaccine. The doses should be obtained at least 2 months apart.  Microbiologists working with certain meningococcal bacteria, military recruits, people at risk during an outbreak, and people who travel to or live in countries with a high rate of meningitis should be immunized. A first-year college student up through age   21 years who is living in a residence hall should receive a dose if she did not receive a dose on or after her 16th birthday. Adults who have certain high-risk conditions should receive one or more doses of vaccine.  Hepatitis A vaccine. Adults who wish to be protected from this disease, have certain high-risk conditions, work with hepatitis A-infected animals, work in hepatitis A research labs, or travel to or work in countries with a high rate of hepatitis A should be immunized. Adults who were previously unvaccinated and who anticipate close contact with an international adoptee during the first 60 days after arrival in the [place] from a country with a high rate of hepatitis A should be immunized.  Hepatitis B vaccine. Adults who wish to be protected from this disease, have certain high-risk conditions, may be exposed to blood or other infectious body fluids, are household contacts or sex partners of hepatitis B positive people, are clients or workers in certain care facilities, or travel to or work in countries with a high rate of hepatitis B should be immunized.  Haemophilus influenzae type b (Hib) vaccine. A previously unvaccinated person with asplenia or sickle cell disease or having a scheduled splenectomy should receive 1 dose of Hib vaccine. Regardless of previous immunization, a recipient of a hematopoietic stem cell transplant should receive a 3-dose series 6-12 months after her successful transplant. Hib vaccine is not recommended for adults with HIV infection. Preventive Services / Frequency Ages 64 to 68 years  Blood pressure check.** / Every 1 to 2 years.  Lipid and cholesterol check.** / Every 5 years beginning at age  22.  Clinical breast exam.** / Every 3 years for women in their 88s and 53s.  BRCA-related cancer risk assessment.** / For women who have family members with a BRCA-related cancer (breast, ovarian, tubal, or peritoneal cancers).  Pap test.** / Every 2 years from ages 90 through 51. Every 3 years starting at age 21 through age 56 or 3 with a history of 3 consecutive normal Pap tests.  HPV screening.** / Every 3 years from ages 24 through ages 1 to 46 with a history of 3 consecutive normal Pap tests.  Hepatitis C blood test.** / For any individual with known risks for hepatitis C.  Skin self-exam. / Monthly.  Influenza vaccine. / Every year.  Tetanus, diphtheria, and acellular pertussis (Tdap, Td) vaccine.** / Consult your health care provider. Pregnant women should receive 1 dose of Tdap vaccine during each pregnancy. 1 dose of Td every 10 years.  Varicella vaccine.** / Consult your health care provider. Pregnant females who do not have evidence of immunity should receive the first dose after pregnancy.  HPV vaccine. / 3 doses over 6 months, if 72 and younger. The vaccine is not recommended for use in pregnant females. However, pregnancy testing is not needed before receiving a dose.  Measles, mumps, rubella (MMR) vaccine.** / You need at least 1 dose of MMR if you were born in 1957 or later. You may also need a 2nd dose. For females of childbearing age, rubella immunity should be determined. If there is no evidence of immunity, females who are not pregnant should be vaccinated. If there is no evidence of immunity, females who are pregnant should delay immunization until after pregnancy.  Pneumococcal 13-valent conjugate (PCV13) vaccine.** / Consult your health care provider.  Pneumococcal polysaccharide (PPSV23) vaccine.** / 1 to 2 doses if you smoke cigarettes or if you have certain conditions.  Meningococcal vaccine.** /  1 dose if you are age 19 to 21 years and a first-year college  student living in a residence hall, or have one of several medical conditions, you need to get vaccinated against meningococcal disease. You may also need additional booster doses.  Hepatitis A vaccine.** / Consult your health care provider.  Hepatitis B vaccine.** / Consult your health care provider.  Haemophilus influenzae type b (Hib) vaccine.** / Consult your health care provider. Ages 40 to 64 years  Blood pressure check.** / Every 1 to 2 years.  Lipid and cholesterol check.** / Every 5 years beginning at age 20 years.  Lung cancer screening. / Every year if you are aged 55-80 years and have a 30-pack-year history of smoking and currently smoke or have quit within the past 15 years. Yearly screening is stopped once you have quit smoking for at least 15 years or develop a health problem that would prevent you from having lung cancer treatment.  Clinical breast exam.** / Every year after age 40 years.  BRCA-related cancer risk assessment.** / For women who have family members with a BRCA-related cancer (breast, ovarian, tubal, or peritoneal cancers).  Mammogram.** / Every year beginning at age 40 years and continuing for as long as you are in good health. Consult with your health care provider.  Pap test.** / Every 3 years starting at age 30 years through age 65 or 70 years with a history of 3 consecutive normal Pap tests.  HPV screening.** / Every 3 years from ages 30 years through ages 65 to 70 years with a history of 3 consecutive normal Pap tests.  Fecal occult blood test (FOBT) of stool. / Every year beginning at age 50 years and continuing until age 75 years. You may not need to do this test if you get a colonoscopy every 10 years.  Flexible sigmoidoscopy or colonoscopy.** / Every 5 years for a flexible sigmoidoscopy or every 10 years for a colonoscopy beginning at age 50 years and continuing until age 75 years.  Hepatitis C blood test.** / For all people born from 1945 through  1965 and any individual with known risks for hepatitis C.  Skin self-exam. / Monthly.  Influenza vaccine. / Every year.  Tetanus, diphtheria, and acellular pertussis (Tdap/Td) vaccine.** / Consult your health care provider. Pregnant women should receive 1 dose of Tdap vaccine during each pregnancy. 1 dose of Td every 10 years.  Varicella vaccine.** / Consult your health care provider. Pregnant females who do not have evidence of immunity should receive the first dose after pregnancy.  Zoster vaccine.** / 1 dose for adults aged 60 years or older.  Measles, mumps, rubella (MMR) vaccine.** / You need at least 1 dose of MMR if you were born in 1957 or later. You may also need a 2nd dose. For females of childbearing age, rubella immunity should be determined. If there is no evidence of immunity, females who are not pregnant should be vaccinated. If there is no evidence of immunity, females who are pregnant should delay immunization until after pregnancy.  Pneumococcal 13-valent conjugate (PCV13) vaccine.** / Consult your health care provider.  Pneumococcal polysaccharide (PPSV23) vaccine.** / 1 to 2 doses if you smoke cigarettes or if you have certain conditions.  Meningococcal vaccine.** / Consult your health care provider.  Hepatitis A vaccine.** / Consult your health care provider.  Hepatitis B vaccine.** / Consult your health care provider.  Haemophilus influenzae type b (Hib) vaccine.** / Consult your health care provider. Ages 65   years and over  Blood pressure check.** / Every 1 to 2 years.  Lipid and cholesterol check.** / Every 5 years beginning at age 22 years.  Lung cancer screening. / Every year if you are aged 73-80 years and have a 30-pack-year history of smoking and currently smoke or have quit within the past 15 years. Yearly screening is stopped once you have quit smoking for at least 15 years or develop a health problem that would prevent you from having lung cancer  treatment.  Clinical breast exam.** / Every year after age 4 years.  BRCA-related cancer risk assessment.** / For women who have family members with a BRCA-related cancer (breast, ovarian, tubal, or peritoneal cancers).  Mammogram.** / Every year beginning at age 40 years and continuing for as long as you are in good health. Consult with your health care provider.  Pap test.** / Every 3 years starting at age 9 years through age 34 or 91 years with 3 consecutive normal Pap tests. Testing can be stopped between 65 and 70 years with 3 consecutive normal Pap tests and no abnormal Pap or HPV tests in the past 10 years.  HPV screening.** / Every 3 years from ages 57 years through ages 64 or 45 years with a history of 3 consecutive normal Pap tests. Testing can be stopped between 65 and 70 years with 3 consecutive normal Pap tests and no abnormal Pap or HPV tests in the past 10 years.  Fecal occult blood test (FOBT) of stool. / Every year beginning at age 15 years and continuing until age 17 years. You may not need to do this test if you get a colonoscopy every 10 years.  Flexible sigmoidoscopy or colonoscopy.** / Every 5 years for a flexible sigmoidoscopy or every 10 years for a colonoscopy beginning at age 86 years and continuing until age 71 years.  Hepatitis C blood test.** / For all people born from [birth year] through 1963 and any individual with known risks for hepatitis C.  Osteoporosis screening.** / A one-time screening for women ages 83 years and over and women at risk for fractures or osteoporosis.  Skin self-exam. / Monthly.  Influenza vaccine. / Every year.  Tetanus, diphtheria, and acellular pertussis (Tdap/Td) vaccine.** / 1 dose of Td every 10 years.  Varicella vaccine.** / Consult your health care provider.  Zoster vaccine.** / 1 dose for adults aged 61 years or older.  Pneumococcal 13-valent conjugate (PCV13) vaccine.** / Consult your health care provider.  Pneumococcal  polysaccharide (PPSV23) vaccine.** / 1 dose for all adults aged 28 years and older.  Meningococcal vaccine.** / Consult your health care provider.  Hepatitis A vaccine.** / Consult your health care provider.  Hepatitis B vaccine.** / Consult your health care provider.  Haemophilus influenzae type b (Hib) vaccine.** / Consult your health care provider. ** Family history and personal history of risk and conditions may change your health care provider's recommendations. Document Released: 12/16/2001 Document Revised: 03/06/2014 Document Reviewed: 03/17/2011 [facility] Patient Information 2015 [place], [place]. This information is not intended to replace advice given to you by your health care provider. Make sure you discuss any questions you have with your health care provider.

## 2015-04-24 NOTE — Assessment & Plan Note (Signed)
Pap today, no concerns on exam.  

## 2015-04-24 NOTE — Progress Notes (Signed)
Pre visit review using our clinic review tool, if applicable. No additional management support is needed unless otherwise documented below in the visit note. 

## 2015-04-25 LAB — COMPREHENSIVE METABOLIC PANEL
ALBUMIN: 4.4 g/dL (ref 3.5–5.2)
ALK PHOS: 36 U/L — AB (ref 39–117)
ALT: 11 U/L (ref 0–35)
AST: 20 U/L (ref 0–37)
BILIRUBIN TOTAL: 0.4 mg/dL (ref 0.2–1.2)
BUN: 9 mg/dL (ref 6–23)
CALCIUM: 9.2 mg/dL (ref 8.4–10.5)
CO2: 26 mEq/L (ref 19–32)
Chloride: 102 mEq/L (ref 96–112)
Creatinine, Ser: 0.68 mg/dL (ref 0.40–1.20)
GFR: 98.69 mL/min (ref >60.00)
GLUCOSE: 75 mg/dL (ref 70–99)
POTASSIUM: 3.6 meq/L (ref 3.5–5.1)
SODIUM: 135 meq/L (ref 135–145)
TOTAL PROTEIN: 7.7 g/dL (ref 6.0–8.3)

## 2015-04-25 LAB — LIPID PANEL
CHOLESTEROL: 192 mg/dL (ref 0–200)
HDL: 68.1 mg/dL (ref >39.00)
LDL Cholesterol: 109 mg/dL — ABNORMAL HIGH (ref 0–99)
NonHDL: 123.9
TRIGLYCERIDES: 77 mg/dL (ref 0.0–149.0)
Total CHOL/HDL Ratio: 3
VLDL: 15.4 mg/dL (ref 0.0–40.0)

## 2015-04-27 ENCOUNTER — Ambulatory Visit (HOSPITAL_BASED_OUTPATIENT_CLINIC_OR_DEPARTMENT_OTHER)
Admission: RE | Admit: 2015-04-27 | Discharge: 2015-04-27 | Disposition: A | Discharge status: Home / Self Care | Payer: 59 | Source: Ambulatory Visit | Attending: Family Medicine | Admitting: Family Medicine

## 2015-04-27 DIAGNOSIS — Z124 Encounter for screening for malignant neoplasm of cervix: Secondary | ICD-10-CM

## 2015-04-27 DIAGNOSIS — E782 Mixed hyperlipidemia: Secondary | ICD-10-CM

## 2015-04-27 DIAGNOSIS — Z1231 Encounter for screening mammogram for malignant neoplasm of breast: Secondary | ICD-10-CM | POA: Insufficient documentation

## 2015-04-27 DIAGNOSIS — R928 Other abnormal and inconclusive findings on diagnostic imaging of breast: Secondary | ICD-10-CM | POA: Insufficient documentation

## 2015-04-27 DIAGNOSIS — E876 Hypokalemia: Secondary | ICD-10-CM

## 2015-04-27 DIAGNOSIS — Z1239 Encounter for other screening for malignant neoplasm of breast: Secondary | ICD-10-CM

## 2015-04-27 LAB — CYTOLOGY - PAP

## 2015-05-01 ENCOUNTER — Other Ambulatory Visit: Payer: Self-pay | Admitting: Family Medicine

## 2015-05-01 DIAGNOSIS — R928 Other abnormal and inconclusive findings on diagnostic imaging of breast: Secondary | ICD-10-CM

## 2015-05-07 ENCOUNTER — Encounter: Payer: Self-pay | Admitting: Family Medicine

## 2015-05-07 DIAGNOSIS — E785 Hyperlipidemia, unspecified: Secondary | ICD-10-CM | POA: Insufficient documentation

## 2015-05-07 DIAGNOSIS — E782 Mixed hyperlipidemia: Secondary | ICD-10-CM

## 2015-05-07 HISTORY — DX: Mixed hyperlipidemia: E78.2

## 2015-05-07 NOTE — Progress Notes (Signed)
Susan Richard  [id number] 1965/12/15 05/07/2015      Progress Note-Follow Up  Subjective  Chief Complaint  Chief Complaint  Patient presents with  . Annual Exam    HPI  Patient is a 49 y.o. female in today for routine medical care. Patient is in today for annual exam. She reports feeling well. No recent illness. No acute concerns. Denies CP/palp/SOB/HA/congestion/fevers/GI or GU c/o. Taking meds as prescribed  Past Medical History  Diagnosis Date  . Chicken pox as a child  . Cervical cancer screening 03/31/2012  . Preventative health care 03/31/2012  . Hyperlipidemia, mixed 05/07/2015    Past Surgical History  Procedure Laterality Date  . Cesarean section  1995 and 1997    X 2    History reviewed. No pertinent family history.  History   Social History  . Marital Status: Married    Spouse Name: N/A  . Number of Children: N/A  . Years of Education: N/A   Occupational History  . Not on file.   Social History Main Topics  . Smoking status: Never Smoker   . Smokeless tobacco: Never Used  . Alcohol Use: No  . Drug Use: No  . Sexual Activity: Not Currently   Other Topics Concern  . Not on file   Social History Narrative    No current outpatient prescriptions on file prior to visit.   No current facility-administered medications on file prior to visit.    No Known Allergies  Review of Systems  Review of Systems  Constitutional: Negative for fever, chills and malaise/fatigue.  HENT: Negative for congestion, hearing loss and nosebleeds.   Eyes: Negative for discharge.  Respiratory: Negative for cough, sputum production, shortness of breath and wheezing.   Cardiovascular: Negative for chest pain, palpitations and leg swelling.  Gastrointestinal: Negative for heartburn, nausea, vomiting, abdominal pain, diarrhea, constipation and blood in stool.  Genitourinary: Negative for dysuria, urgency, frequency and hematuria.  Musculoskeletal: Negative for myalgias, back  pain and falls.  Skin: Negative for rash.  Neurological: Negative for dizziness, tremors, sensory change, focal weakness, loss of consciousness, weakness and headaches.  Endo/Heme/Allergies: Negative for polydipsia. Does not bruise/bleed easily.  Psychiatric/Behavioral: Negative for depression and suicidal ideas. The patient is not nervous/anxious and does not have insomnia.     Objective  BP 102/68 mmHg  Pulse 73  Temp(Src) 98.7 F (37.1 C) (Oral)  Ht 5' (1.524 m)  Wt 107 lb 2 oz (48.592 kg)  BMI 20.92 kg/m2  SpO2 99%  LMP 03/09/2015  Physical Exam  Physical Exam  Constitutional: She is oriented to person, place, and time and well-developed, well-nourished, and in no distress. No distress.  HENT:  Head: Normocephalic and atraumatic.  Right Ear: External ear normal.  Left Ear: External ear normal.  Nose: Nose normal.  Mouth/Throat: Oropharynx is clear and moist. No oropharyngeal exudate.  Eyes: Conjunctivae are normal. Pupils are equal, round, and reactive to light. Right eye exhibits no discharge. Left eye exhibits no discharge. No scleral icterus.  Neck: Normal range of motion. Neck supple. No thyromegaly present.  Cardiovascular: Normal rate, regular rhythm, normal heart sounds and intact distal pulses.   No murmur heard. Pulmonary/Chest: Effort normal and breath sounds normal. No respiratory distress. She has no wheezes. She has no rales.  Abdominal: Soft. Bowel sounds are normal. She exhibits no distension and no mass. There is no tenderness.  Genitourinary: Vagina normal, uterus normal, cervix normal, right adnexa normal and left adnexa normal. No vaginal discharge found.  Musculoskeletal: Normal range of motion. She exhibits no edema or tenderness.  Lymphadenopathy:    She has no cervical adenopathy.  Neurological: She is alert and oriented to person, place, and time. She has normal reflexes. No cranial nerve deficit. Coordination normal.  Skin: Skin is warm and dry. No  rash noted. She is not diaphoretic.  Psychiatric: Mood, memory and affect normal.    Lab Results  Component Value Date   TSH 1.54 03/31/2012   Lab Results  Component Value Date   WBC 7.7 03/31/2012   HGB 13.9 03/31/2012   HCT 42.1 03/31/2012   MCV 91.5 03/31/2012   PLT 198.0 03/31/2012   Lab Results  Component Value Date   CREATININE 0.68 04/24/2015   BUN 9 04/24/2015   NA 135 04/24/2015   K 3.6 04/24/2015   CL 102 04/24/2015   CO2 26 04/24/2015   Lab Results  Component Value Date   ALT 11 04/24/2015   AST 20 04/24/2015   ALKPHOS 36* 04/24/2015   BILITOT 0.4 04/24/2015   Lab Results  Component Value Date   CHOL 192 04/24/2015   Lab Results  Component Value Date   HDL 68.10 04/24/2015   Lab Results  Component Value Date   LDLCALC 109* 04/24/2015   Lab Results  Component Value Date   TRIG 77.0 04/24/2015   Lab Results  Component Value Date   CHOLHDL 3 04/24/2015     Assessment & Plan  Cervical cancer screening Pap today, no concerns on exam.   Preventative health care Patient encouraged to maintain heart healthy diet, regular exercise, adequate sleep. Consider daily probiotics. Take medications as prescribed. Given and reviewed copy of ACP documents from [employer] and encouraged to complete and return  Hyperlipidemia, mixed Encouraged heart healthy diet, increase exercise, avoid trans fats, consider a krill oil cap daily

## 2015-05-07 NOTE — Assessment & Plan Note (Signed)
Encouraged heart healthy diet, increase exercise, avoid trans fats, consider a krill oil cap daily 

## 2015-05-08 ENCOUNTER — Ambulatory Visit
Admission: RE | Admit: 2015-05-08 | Discharge: 2015-05-08 | Disposition: A | Discharge status: Home / Self Care | Payer: 59 | Source: Ambulatory Visit | Attending: Family Medicine | Admitting: Family Medicine

## 2015-05-08 DIAGNOSIS — R928 Other abnormal and inconclusive findings on diagnostic imaging of breast: Secondary | ICD-10-CM

## 2015-05-09 ENCOUNTER — Other Ambulatory Visit: Payer: 59

## 2017-06-16 ENCOUNTER — Encounter: Payer: 59 | Admitting: Family Medicine

## 2017-07-14 ENCOUNTER — Ambulatory Visit (INDEPENDENT_AMBULATORY_CARE_PROVIDER_SITE_OTHER): Payer: 59 | Admitting: Family Medicine

## 2017-07-14 ENCOUNTER — Encounter: Payer: Self-pay | Admitting: Family Medicine

## 2017-07-14 VITALS — BP 96/66 | HR 80 | Temp 97.9°F | Ht 60.0 in | Wt 110.4 lb

## 2017-07-14 DIAGNOSIS — E785 Hyperlipidemia, unspecified: Secondary | ICD-10-CM

## 2017-07-14 DIAGNOSIS — Z23 Encounter for immunization: Secondary | ICD-10-CM

## 2017-07-14 DIAGNOSIS — Z1231 Encounter for screening mammogram for malignant neoplasm of breast: Secondary | ICD-10-CM | POA: Diagnosis not present

## 2017-07-14 DIAGNOSIS — Z0000 Encounter for general adult medical examination without abnormal findings: Secondary | ICD-10-CM | POA: Diagnosis not present

## 2017-07-14 DIAGNOSIS — Z1239 Encounter for other screening for malignant neoplasm of breast: Secondary | ICD-10-CM

## 2017-07-14 MED ORDER — TETANUS-DIPHTH-ACELL PERTUSSIS 5-2.5-18.5 LF-MCG/0.5 IM SUSP: 0.5000 mL | Freq: Once | INTRAMUSCULAR | Status: DC

## 2017-07-14 NOTE — Assessment & Plan Note (Addendum)
Patient encouraged to maintain heart healthy diet, regular exercise, adequate sleep. Consider daily probiotics. Take medications as prescribed. Given Tdap and flu shot today. Mgm ordered today. MM ordered, Tdap given, flu shot given

## 2017-07-14 NOTE — Patient Instructions (Signed)
Ibuprofen/Advil/Motrin 200 mg tabs, 2 tabs twice daily for first one to two days of menstrual cycle. Take early Preventive Care 40-64 Years, Female Preventive care refers to lifestyle choices and visits with your health care provider that can promote health and wellness. What does preventive care include?  A yearly physical exam. This is also called an annual well check.  Dental exams once or twice a year.  Routine eye exams. Ask your health care provider how often you should have your eyes checked.  Personal lifestyle choices, including: ? Daily care of your teeth and gums. ? Regular physical activity. ? Eating a healthy diet. ? Avoiding tobacco and drug use. ? Limiting alcohol use. ? Practicing safe sex. ? Taking low-dose aspirin daily starting at age 85. ? Taking vitamin and mineral supplements as recommended by your health care provider. What happens during an annual well check? The services and screenings done by your health care provider during your annual well check will depend on your age, overall health, lifestyle risk factors, and family history of disease. Counseling Your health care provider may ask you questions about your:  Alcohol use.  Tobacco use.  Drug use.  Emotional well-being.  Home and relationship well-being.  Sexual activity.  Eating habits.  Work and work Astronomer.  Method of birth control.  Menstrual cycle.  Pregnancy history.  Screening You may have the following tests or measurements:  Height, weight, and BMI.  Blood pressure.  Lipid and cholesterol levels. These may be checked every 5 years, or more frequently if you are over 36 years old.  Skin check.  Lung cancer screening. You may have this screening every year starting at age 85 if you have a 30-pack-year history of smoking and currently smoke or have quit within the past 15 years.  Fecal occult blood test (FOBT) of the stool. You may have this test every year starting at  age 29.  Flexible sigmoidoscopy or colonoscopy. You may have a sigmoidoscopy every 5 years or a colonoscopy every 10 years starting at age 71.  Hepatitis C blood test.  Hepatitis B blood test.  Sexually transmitted disease (STD) testing.  Diabetes screening. This is done by checking your blood sugar (glucose) after you have not eaten for a while (fasting). You may have this done every 1-3 years.  Mammogram. This may be done every 1-2 years. Talk to your health care provider about when you should start having regular mammograms. This may depend on whether you have a family history of breast cancer.  BRCA-related cancer screening. This may be done if you have a family history of breast, ovarian, tubal, or peritoneal cancers.  Pelvic exam and Pap test. This may be done every 3 years starting at age 68. Starting at age 5, this may be done every 5 years if you have a Pap test in combination with an HPV test.  Bone density scan. This is done to screen for osteoporosis. You may have this scan if you are at high risk for osteoporosis.  Discuss your test results, treatment options, and if necessary, the need for more tests with your health care provider. Vaccines Your health care provider may recommend certain vaccines, such as:  Influenza vaccine. This is recommended every year.  Tetanus, diphtheria, and acellular pertussis (Tdap, Td) vaccine. You may need a Td booster every 10 years.  Varicella vaccine. You may need this if you have not been vaccinated.  Zoster vaccine. You may need this after age 24.  Measles,  mumps, and rubella (MMR) vaccine. You may need at least one dose of MMR if you were born in 1957 or later. You may also need a second dose.  Pneumococcal 13-valent conjugate (PCV13) vaccine. You may need this if you have certain conditions and were not previously vaccinated.  Pneumococcal polysaccharide (PPSV23) vaccine. You may need one or two doses if you smoke cigarettes or if  you have certain conditions.  Meningococcal vaccine. You may need this if you have certain conditions.  Hepatitis A vaccine. You may need this if you have certain conditions or if you travel or work in places where you may be exposed to hepatitis A.  Hepatitis B vaccine. You may need this if you have certain conditions or if you travel or work in places where you may be exposed to hepatitis B.  Haemophilus influenzae type b (Hib) vaccine. You may need this if you have certain conditions.  Talk to your health care provider about which screenings and vaccines you need and how often you need them. This information is not intended to replace advice given to you by your health care provider. Make sure you discuss any questions you have with your health care provider. Document Released: 11/16/2015 Document Revised: 07/09/2016 Document Reviewed: 08/21/2015 Elsevier Interactive Patient Education  2017 [employer].

## 2017-07-15 LAB — COMPREHENSIVE METABOLIC PANEL
ALBUMIN: 4.5 g/dL (ref 3.5–5.2)
ALK PHOS: 44 U/L (ref 39–117)
ALT: 17 U/L (ref 0–35)
AST: 23 U/L (ref 0–37)
BUN: 12 mg/dL (ref 6–23)
CO2: 25 mEq/L (ref 19–32)
Calcium: 9.5 mg/dL (ref 8.4–10.5)
Chloride: 102 mEq/L (ref 96–112)
Creatinine, Ser: 0.65 mg/dL (ref 0.40–1.20)
GFR: 102.99 mL/min (ref >60.00)
Glucose, Bld: 84 mg/dL (ref 70–99)
POTASSIUM: 4.3 meq/L (ref 3.5–5.1)
Sodium: 135 mEq/L (ref 135–145)
Total Bilirubin: 0.3 mg/dL (ref 0.2–1.2)
Total Protein: 7.7 g/dL (ref 6.0–8.3)

## 2017-07-15 LAB — LIPID PANEL
Cholesterol: 200 mg/dL (ref 0–200)
HDL: 87.7 mg/dL (ref >39.00)
LDL CALC: 99 mg/dL (ref 0–99)
NonHDL: 112.26
Total CHOL/HDL Ratio: 2
Triglycerides: 66 mg/dL (ref 0.0–149.0)
VLDL: 13.2 mg/dL (ref 0.0–40.0)

## 2017-07-15 LAB — CBC
HCT: 39.8 % (ref 36.0–46.0)
Hemoglobin: 12.8 g/dL (ref 12.0–15.0)
MCHC: 32.3 g/dL (ref 30.0–36.0)
MCV: 86.5 fl (ref 78.0–100.0)
Platelets: 268 10*3/uL (ref 150.0–400.0)
RBC: 4.6 Mil/uL (ref 3.87–5.11)
RDW: 14.4 % (ref 11.5–15.5)
WBC: 9.7 10*3/uL (ref 4.0–10.5)

## 2017-07-16 LAB — TSH: TSH: 1.94 u[IU]/mL (ref 0.35–4.50)

## 2017-07-19 NOTE — Progress Notes (Signed)
Patient ID: Susan Richard, female   DOB: September 01, 1966, 51 y.o.   MRN: [id number]   Subjective:    Patient ID: Susan Richard, female    DOB: 1966-03-17, 51 y.o.   MRN: [id number]  Chief Complaint  Patient presents with  . Annual Exam    Patient is here today for a CPE.  Pap is not due until 6.2019.  She would like to get her Tdap and Flu shots today.    HPI Patient is in today for annual preventative exam. Notes she is feeling well. No recent febrile illness or hospitalizations. She is noting some dysmenorrhea with cycles but no other significant change in cycles. She is staying active and is trying to maintain a hear thealthy diet. Denies CP/palp/SOB/HA/congestion/fevers/GI or GU c/o. Taking meds as prescribed Past Medical History:  Diagnosis Date  . Cervical cancer screening 03/31/2012  . Chicken pox as a child  . Hyperlipidemia, mixed 05/07/2015  . Preventative health care 03/31/2012    Past Surgical History:  Procedure Laterality Date  . [place]   X 2    No family history on file.  Social History   Social History  . Marital status: Married    Spouse name: N/A  . Number of children: N/A  . Years of education: N/A   Occupational History  . Not on file.   Social History Main Topics  . Smoking status: Never Smoker  . Smokeless tobacco: Never Used  . Alcohol use No  . Drug use: No  . Sexual activity: Not Currently   Other Topics Concern  . Not on file   Social History Narrative  . No narrative on file    No outpatient prescriptions prior to visit.   No facility-administered medications prior to visit.     No Known Allergies  Review of Systems  Constitutional: Negative for fever and malaise/fatigue.  HENT: Negative for congestion.   Eyes: Negative for blurred vision.  Respiratory: Negative for shortness of breath.   Cardiovascular: Negative for chest pain, palpitations and leg swelling.  Gastrointestinal: Negative for abdominal pain, blood in  stool and nausea.  Genitourinary: Negative for dysuria and frequency.  Musculoskeletal: Negative for falls.  Skin: Negative for rash.  Neurological: Negative for dizziness, loss of consciousness and headaches.  Endo/Heme/Allergies: Negative for environmental allergies.  Psychiatric/Behavioral: Negative for depression. The patient is not nervous/anxious.        Objective:    Physical Exam  Constitutional: She is oriented to person, place, and time. She appears well-developed and well-nourished. No distress.  HENT:  Head: Normocephalic and atraumatic.  Nose: Nose normal.  Eyes: Right eye exhibits no discharge. Left eye exhibits no discharge.  Neck: Normal range of motion. Neck supple.  Cardiovascular: Normal rate and regular rhythm.   No murmur heard. Pulmonary/Chest: Effort normal and breath sounds normal.  Abdominal: Soft. Bowel sounds are normal. There is no tenderness.  Musculoskeletal: She exhibits no edema.  Neurological: She is alert and oriented to person, place, and time.  Skin: Skin is warm and dry.  Psychiatric: She has a normal mood and affect.  Nursing note and vitals reviewed.   BP 96/66 (BP Location: Left Arm, Patient Position: Sitting, Cuff Size: Normal)   Pulse 80   Temp 97.9 F (36.6 C) (Oral)   Ht 5' (1.524 m)   Wt 110 lb 6.4 oz (50.1 kg)   LMP 06/26/2017   SpO2 98%   BMI 21.56 kg/m  Wt Readings from Last  3 Encounters:  07/14/17 110 lb 6.4 oz (50.1 kg)  04/24/15 107 lb 2 oz (48.6 kg)  03/31/12 111 lb 12.8 oz (50.7 kg)     Lab Results  Component Value Date   WBC 9.7 07/14/2017   HGB 12.8 07/14/2017   HCT 39.8 07/14/2017   PLT 268.0 07/14/2017   GLUCOSE 84 07/14/2017   CHOL 200 07/14/2017   TRIG 66.0 07/14/2017   HDL 87.70 07/14/2017   LDLCALC 99 07/14/2017   ALT 17 07/14/2017   AST 23 07/14/2017   NA 135 07/14/2017   K 4.3 07/14/2017   CL 102 07/14/2017   CREATININE 0.65 07/14/2017   BUN 12 07/14/2017   CO2 25 07/14/2017   TSH 1.94  07/14/2017    Lab Results  Component Value Date   TSH 1.94 07/14/2017   Lab Results  Component Value Date   WBC 9.7 07/14/2017   HGB 12.8 07/14/2017   HCT 39.8 07/14/2017   MCV 86.5 07/14/2017   PLT 268.0 07/14/2017   Lab Results  Component Value Date   NA 135 07/14/2017   K 4.3 07/14/2017   CO2 25 07/14/2017   GLUCOSE 84 07/14/2017   BUN 12 07/14/2017   CREATININE 0.65 07/14/2017   BILITOT 0.3 07/14/2017   ALKPHOS 44 07/14/2017   AST 23 07/14/2017   ALT 17 07/14/2017   PROT 7.7 07/14/2017   ALBUMIN 4.5 07/14/2017   CALCIUM 9.5 07/14/2017   GFR 102.99 07/14/2017   Lab Results  Component Value Date   CHOL 200 07/14/2017   Lab Results  Component Value Date   HDL 87.70 07/14/2017   Lab Results  Component Value Date   LDLCALC 99 07/14/2017   Lab Results  Component Value Date   TRIG 66.0 07/14/2017   Lab Results  Component Value Date   CHOLHDL 2 07/14/2017   No results found for: HGBA1C     Assessment & Plan:   Problem List Items Addressed This Visit    Preventative health care    Patient encouraged to maintain heart healthy diet, regular exercise, adequate sleep. Consider daily probiotics. Take medications as prescribed. Given Tdap and flu shot today. Mgm ordered today. MM ordered, Tdap given, flu shot given      Relevant Orders   CBC (Completed)   Comprehensive metabolic panel (Completed)   Lipid panel (Completed)   TSH (Completed)    Other Visit Diagnoses    Breast cancer screening    -  Primary   Relevant Orders   MM Digital Screening   Hyperlipidemia, unspecified hyperlipidemia type       Relevant Orders   Lipid panel (Completed)   Need for immunization against influenza       Relevant Orders   Flu Vaccine QUAD 36+ mos IM (Completed)   Need for Tdap vaccination       Relevant Orders   Tdap vaccine greater than or equal to 7yo IM (Completed)      Ms. Campoverde does not currently have inpatient medications on file.  Meds ordered this  encounter  Medications  . DISCONTD: Tdap (BOOSTRIX) injection 0.5 mL    CMA served as Education administrator during this visit. History, Physical and Plan performed by medical provider. Documentation and orders reviewed and attested to.  Penni Homans, MD

## 2018-09-06 ENCOUNTER — Other Ambulatory Visit: Payer: Self-pay

## 2018-09-06 ENCOUNTER — Other Ambulatory Visit (HOSPITAL_COMMUNITY)
Admission: RE | Admit: 2018-09-06 | Discharge: 2018-09-06 | Disposition: A | Discharge status: Home / Self Care | Payer: 59 | Source: Ambulatory Visit | Attending: Family Medicine | Admitting: Family Medicine

## 2018-09-06 ENCOUNTER — Ambulatory Visit (INDEPENDENT_AMBULATORY_CARE_PROVIDER_SITE_OTHER): Payer: 59 | Admitting: Family Medicine

## 2018-09-06 ENCOUNTER — Encounter: Payer: Self-pay | Admitting: Family Medicine

## 2018-09-06 VITALS — BP 108/68 | HR 68 | Temp 98.0°F | Resp 14 | Ht 60.5 in | Wt 112.0 lb

## 2018-09-06 DIAGNOSIS — Z0000 Encounter for general adult medical examination without abnormal findings: Secondary | ICD-10-CM

## 2018-09-06 DIAGNOSIS — L659 Nonscarring hair loss, unspecified: Secondary | ICD-10-CM | POA: Insufficient documentation

## 2018-09-06 DIAGNOSIS — D649 Anemia, unspecified: Secondary | ICD-10-CM

## 2018-09-06 DIAGNOSIS — Z124 Encounter for screening for malignant neoplasm of cervix: Secondary | ICD-10-CM | POA: Diagnosis not present

## 2018-09-06 DIAGNOSIS — Z1239 Encounter for other screening for malignant neoplasm of breast: Secondary | ICD-10-CM

## 2018-09-06 DIAGNOSIS — E782 Mixed hyperlipidemia: Secondary | ICD-10-CM

## 2018-09-06 LAB — CBC
HCT: 32.7 % — ABNORMAL LOW (ref 36.0–46.0)
Hemoglobin: 10.5 g/dL — ABNORMAL LOW (ref 12.0–15.0)
MCHC: 32 g/dL (ref 30.0–36.0)
MCV: 75.2 fl — ABNORMAL LOW (ref 78.0–100.0)
PLATELETS: 308 10*3/uL (ref 150.0–400.0)
RBC: 4.35 Mil/uL (ref 3.87–5.11)
RDW: 17.2 % — ABNORMAL HIGH (ref 11.5–15.5)
WBC: 6.1 10*3/uL (ref 4.0–10.5)

## 2018-09-06 LAB — COMPREHENSIVE METABOLIC PANEL
ALK PHOS: 36 U/L — AB (ref 39–117)
ALT: 12 U/L (ref 0–35)
AST: 19 U/L (ref 0–37)
Albumin: 4.5 g/dL (ref 3.5–5.2)
BUN: 13 mg/dL (ref 6–23)
CALCIUM: 9.1 mg/dL (ref 8.4–10.5)
CO2: 26 meq/L (ref 19–32)
Chloride: 104 mEq/L (ref 96–112)
Creatinine, Ser: 0.65 mg/dL (ref 0.40–1.20)
GFR: 102.51 mL/min (ref >60.00)
Glucose, Bld: 82 mg/dL (ref 70–99)
Potassium: 4.3 mEq/L (ref 3.5–5.1)
Sodium: 138 mEq/L (ref 135–145)
Total Bilirubin: 0.4 mg/dL (ref 0.2–1.2)
Total Protein: 7.3 g/dL (ref 6.0–8.3)

## 2018-09-06 LAB — LIPID PANEL
CHOL/HDL RATIO: 3
Cholesterol: 198 mg/dL (ref 0–200)
HDL: 64.9 mg/dL (ref >39.00)
LDL Cholesterol: 117 mg/dL — ABNORMAL HIGH (ref 0–99)
NonHDL: 132.75
TRIGLYCERIDES: 79 mg/dL (ref 0.0–149.0)
VLDL: 15.8 mg/dL (ref 0.0–40.0)

## 2018-09-06 LAB — TSH: TSH: 1.54 u[IU]/mL (ref 0.35–4.50)

## 2018-09-06 NOTE — Patient Instructions (Addendum)
Shingrix is the new shingles shot 2 shots over 2-6 months, check with insurance regarding coverage then call for appt or can get at next appt.   [facility], all good, Dr Lyndel Safe is at med center [facility], Dr Hilarie Fredrickson, Dr Silverio Decamp are at [facility] office  Biotin, Zinc and fatty acid supplement such as Fish oil or Krill oil   Preventive Care 40-64 Years, Female Preventive care refers to lifestyle choices and visits with your health care provider that can promote health and wellness. What does preventive care include?  A yearly physical exam. This is also called an annual well check.  Dental exams once or twice a year.  Routine eye exams. Ask your health care provider how often you should have your eyes checked.  Personal lifestyle choices, including: ? Daily care of your teeth and gums. ? Regular physical activity. ? Eating a healthy diet. ? Avoiding tobacco and drug use. ? Limiting alcohol use. ? Practicing safe sex. ? Taking low-dose aspirin daily starting at age 50. ? Taking vitamin and mineral supplements as recommended by your health care provider. What happens during an annual well check? The services and screenings done by your health care provider during your annual well check will depend on your age, overall health, lifestyle risk factors, and family history of disease. Counseling Your health care provider may ask you questions about your:  Alcohol use.  Tobacco use.  Drug use.  Emotional well-being.  Home and relationship well-being.  Sexual activity.  Eating habits.  Work and work Statistician.  Method of birth control.  Menstrual cycle.  Pregnancy history.  Screening You may have the following tests or measurements:  Height, weight, and BMI.  Blood pressure.  Lipid and cholesterol levels. These may be checked every 5 years, or more frequently if you are over 24 years old.  Skin check.  Lung cancer screening. You may have this screening  every year starting at age 21 if you have a 30-pack-year history of smoking and currently smoke or have quit within the past 15 years.  Fecal occult blood test (FOBT) of the stool. You may have this test every year starting at age 50.  Flexible sigmoidoscopy or colonoscopy. You may have a sigmoidoscopy every 5 years or a colonoscopy every 10 years starting at age 50.  Hepatitis C blood test.  Hepatitis B blood test.  Sexually transmitted disease (STD) testing.  Diabetes screening. This is done by checking your blood sugar (glucose) after you have not eaten for a while (fasting). You may have this done every 1-3 years.  Mammogram. This may be done every 1-2 years. Talk to your health care provider about when you should start having regular mammograms. This may depend on whether you have a family history of breast cancer.  BRCA-related cancer screening. This may be done if you have a family history of breast, ovarian, tubal, or peritoneal cancers.  Pelvic exam and Pap test. This may be done every 3 years starting at age 35. Starting at age 50, this may be done every 5 years if you have a Pap test in combination with an HPV test.  Bone density scan. This is done to screen for osteoporosis. You may have this scan if you are at high risk for osteoporosis.  Discuss your test results, treatment options, and if necessary, the need for more tests with your health care provider. Vaccines Your health care provider may recommend certain vaccines, such as:  Influenza vaccine. This is recommended  every year.  Tetanus, diphtheria, and acellular pertussis (Tdap, Td) vaccine. You may need a Td booster every 10 years.  Varicella vaccine. You may need this if you have not been vaccinated.  Zoster vaccine. You may need this after age 17.  Measles, mumps, and rubella (MMR) vaccine. You may need at least one dose of MMR if you were born in 1957 or later. You may also need a second dose.  Pneumococcal  13-valent conjugate (PCV13) vaccine. You may need this if you have certain conditions and were not previously vaccinated.  Pneumococcal polysaccharide (PPSV23) vaccine. You may need one or two doses if you smoke cigarettes or if you have certain conditions.  Meningococcal vaccine. You may need this if you have certain conditions.  Hepatitis A vaccine. You may need this if you have certain conditions or if you travel or work in places where you may be exposed to hepatitis A.  Hepatitis B vaccine. You may need this if you have certain conditions or if you travel or work in places where you may be exposed to hepatitis B.  Haemophilus influenzae type b (Hib) vaccine. You may need this if you have certain conditions.  Talk to your health care provider about which screenings and vaccines you need and how often you need them. This information is not intended to replace advice given to you by your health care provider. Make sure you discuss any questions you have with your health care provider. Document Released: 11/16/2015 Document Revised: 07/09/2016 Document Reviewed: 08/21/2015 Elsevier Interactive Patient Education  [employer].

## 2018-09-06 NOTE — Assessment & Plan Note (Signed)
Pap today, no concerns on exam. On 5 th day of menstrual cycle

## 2018-09-06 NOTE — Assessment & Plan Note (Signed)
Encouraged Zinc, biotin and fish oil. Adequate hydration, check thyroid. Report worsening symptoms

## 2018-09-06 NOTE — Assessment & Plan Note (Addendum)
Patient encouraged to maintain heart healthy diet, regular exercise, adequate sleep. Consider daily probiotics. Take medications as prescribed. Labs ordered and reviewed. MGM ordered

## 2018-09-06 NOTE — Progress Notes (Signed)
Subjective:    Patient ID: Susan Richard, female    DOB: 01-07-1966, 52 y.o.   MRN: [id number]  Chief Complaint  Patient presents with  . Annual Exam    Concern with hair loss, irregular menses    HPI Patient is in today for annual preventative exam. No recent febrile illness or hospitalizations. Only complaint is recent hairloss. No GYN or breast complaints. She is staying active and maintains a heart healthy diet. Denies CP/palp/SOB/HA/congestion/fevers/GI or GU c/o. Taking meds as prescribed  Past Medical History:  Diagnosis Date  . Cervical cancer screening 03/31/2012  . Chicken pox as a child  . Hyperlipidemia, mixed 05/07/2015  . Preventative health care 03/31/2012    Past Surgical History:  Procedure Laterality Date  . [place]   X 2    No family history on file.  Social History   Socioeconomic History  . Marital status: Married    Spouse name: Not on file  . Number of children: Not on file  . Years of education: Not on file  . Highest education level: Not on file  Occupational History  . Occupation: Hair stylist  Social Needs  . Financial resource strain: Not on file  . Food insecurity:    Worry: Not on file    Inability: Not on file  . Transportation needs:    Medical: Not on file    Non-medical: Not on file  Tobacco Use  . Smoking status: Never Smoker  . Smokeless tobacco: Never Used  Substance and Sexual Activity  . Alcohol use: No  . Drug use: No  . Sexual activity: Not Currently  Lifestyle  . Physical activity:    Days per week: 4 days    Minutes per session: 40 min  . Stress: Only a little  Relationships  . Social connections:    Talks on phone: More than three times a week    Gets together: More than three times a week    Attends religious service: 1 to 4 times per year    Active member of club or organization: No    Attends meetings of clubs or organizations: Never    Relationship status: Married  . Intimate partner  violence:    Fear of current or ex partner: No    Emotionally abused: No    Physically abused: No    Forced sexual activity: No  Other Topics Concern  . Not on file  Social History Narrative  . Not on file    No outpatient medications prior to visit.   No facility-administered medications prior to visit.     No Known Allergies  Review of Systems  Constitutional: Negative for chills, fever and malaise/fatigue.  HENT: Negative for congestion and hearing loss.   Eyes: Negative for discharge.  Respiratory: Negative for cough, sputum production and shortness of breath.   Cardiovascular: Negative for chest pain, palpitations and leg swelling.  Gastrointestinal: Negative for abdominal pain, blood in stool, constipation, diarrhea, heartburn, nausea and vomiting.  Genitourinary: Negative for dysuria, frequency, hematuria and urgency.  Musculoskeletal: Negative for back pain, falls and myalgias.  Skin: Negative for rash.  Neurological: Negative for dizziness, sensory change, loss of consciousness, weakness and headaches.  Endo/Heme/Allergies: Negative for environmental allergies. Does not bruise/bleed easily.  Psychiatric/Behavioral: Negative for depression and suicidal ideas. The patient is not nervous/anxious and does not have insomnia.        Objective:    Physical Exam  Constitutional: She is  oriented to person, place, and time. She appears well-developed and well-nourished. No distress.  HENT:  Head: Normocephalic and atraumatic.  Eyes: Conjunctivae are normal.  Neck: Neck supple. No thyromegaly present.  Cardiovascular: Normal rate, regular rhythm and normal heart sounds.  No murmur heard. Pulmonary/Chest: Effort normal and breath sounds normal. No respiratory distress.  Abdominal: Soft. Bowel sounds are normal. She exhibits no distension and no mass. There is no tenderness.  Genitourinary: Vagina normal and uterus normal. No vaginal discharge found.  Musculoskeletal: She  exhibits no edema.  Lymphadenopathy:    She has no cervical adenopathy.  Neurological: She is alert and oriented to person, place, and time.  Skin: Skin is warm and dry.  Psychiatric: She has a normal mood and affect. Her behavior is normal.    BP 108/68 (BP Location: Right Arm, Patient Position: Sitting, Cuff Size: Normal)   Pulse 68   Temp 98 F (36.7 C) (Oral)   Resp 14   Ht 5' 0.5" (1.537 m)   Wt 112 lb (50.8 kg)   LMP 09/02/2018   SpO2 99%   BMI 21.51 kg/m  Wt Readings from Last 3 Encounters:  09/06/18 112 lb (50.8 kg)  07/14/17 110 lb 6.4 oz (50.1 kg)  04/24/15 107 lb 2 oz (48.6 kg)     Lab Results  Component Value Date   WBC 9.7 07/14/2017   HGB 12.8 07/14/2017   HCT 39.8 07/14/2017   PLT 268.0 07/14/2017   GLUCOSE 84 07/14/2017   CHOL 200 07/14/2017   TRIG 66.0 07/14/2017   HDL 87.70 07/14/2017   LDLCALC 99 07/14/2017   ALT 17 07/14/2017   AST 23 07/14/2017   NA 135 07/14/2017   K 4.3 07/14/2017   CL 102 07/14/2017   CREATININE 0.65 07/14/2017   BUN 12 07/14/2017   CO2 25 07/14/2017   TSH 1.94 07/14/2017    Lab Results  Component Value Date   TSH 1.94 07/14/2017   Lab Results  Component Value Date   WBC 9.7 07/14/2017   HGB 12.8 07/14/2017   HCT 39.8 07/14/2017   MCV 86.5 07/14/2017   PLT 268.0 07/14/2017   Lab Results  Component Value Date   NA 135 07/14/2017   K 4.3 07/14/2017   CO2 25 07/14/2017   GLUCOSE 84 07/14/2017   BUN 12 07/14/2017   CREATININE 0.65 07/14/2017   BILITOT 0.3 07/14/2017   ALKPHOS 44 07/14/2017   AST 23 07/14/2017   ALT 17 07/14/2017   PROT 7.7 07/14/2017   ALBUMIN 4.5 07/14/2017   CALCIUM 9.5 07/14/2017   GFR 102.99 07/14/2017   Lab Results  Component Value Date   CHOL 200 07/14/2017   Lab Results  Component Value Date   HDL 87.70 07/14/2017   Lab Results  Component Value Date   LDLCALC 99 07/14/2017   Lab Results  Component Value Date   TRIG 66.0 07/14/2017   Lab Results  Component Value  Date   CHOLHDL 2 07/14/2017   No results found for: HGBA1C     Assessment & Plan:   Problem List Items Addressed This Visit    Cervical cancer screening    Pap today, no concerns on exam. On 5 th day of menstrual cycle      Relevant Orders   Cytology - PAP( [place])   Preventative health care - Primary    Patient encouraged to maintain heart healthy diet, regular exercise, adequate sleep. Consider daily probiotics. Take medications as prescribed. Labs ordered  and reviewed. MGM ordered      Relevant Orders   CBC   Comprehensive metabolic panel   Lipid panel   TSH   Hyperlipidemia, mixed   Hair loss    Encouraged Zinc, biotin and fish oil. Adequate hydration, check thyroid. Report worsening symptoms       Other Visit Diagnoses    Breast cancer screening       Relevant Orders   MM DIAG BREAST TOMO BILATERAL      Susan Richard does not currently have medications on file.  No orders of the defined types were placed in this encounter.    Penni Homans, MD

## 2018-09-07 LAB — CYTOLOGY - PAP: Diagnosis: NEGATIVE

## 2018-09-07 NOTE — Addendum Note (Signed)
Addended by: Magdalene Molly A on: 09/07/2018 09:54 AM   Modules accepted: Orders

## 2018-09-08 ENCOUNTER — Other Ambulatory Visit: Payer: Self-pay | Admitting: Family Medicine

## 2018-09-08 DIAGNOSIS — Z1231 Encounter for screening mammogram for malignant neoplasm of breast: Secondary | ICD-10-CM

## 2018-09-09 ENCOUNTER — Ambulatory Visit
Admission: RE | Admit: 2018-09-09 | Discharge: 2018-09-09 | Disposition: A | Discharge status: Home / Self Care | Payer: 59 | Source: Ambulatory Visit | Attending: Family Medicine | Admitting: Family Medicine

## 2018-09-09 DIAGNOSIS — Z1231 Encounter for screening mammogram for malignant neoplasm of breast: Secondary | ICD-10-CM

## 2018-09-21 ENCOUNTER — Other Ambulatory Visit (INDEPENDENT_AMBULATORY_CARE_PROVIDER_SITE_OTHER): Payer: 59

## 2018-09-21 ENCOUNTER — Telehealth: Payer: Self-pay | Admitting: *Deleted

## 2018-09-21 DIAGNOSIS — K625 Hemorrhage of anus and rectum: Secondary | ICD-10-CM

## 2018-09-21 LAB — FECAL OCCULT BLOOD, IMMUNOCHEMICAL: Fecal Occult Bld: NEGATIVE

## 2018-09-21 NOTE — Telephone Encounter (Signed)
Dx Anemia

## 2018-09-21 NOTE — Telephone Encounter (Signed)
Orders placed   Please advise

## 2018-09-21 NOTE — Telephone Encounter (Signed)
Received call from [facility] lab that pt returned an IFOB kit and orders need to be placed so they can release the result. Please advise DX for IFOB?

## 2018-09-23 ENCOUNTER — Telehealth: Payer: Self-pay | Admitting: Family Medicine

## 2018-09-23 NOTE — Telephone Encounter (Signed)
Copied from [place] [id number]. Topic: General - Call Back - No Documentation >> Sep 23, 2018  2:28 PM Cecelia Byars, NT wrote: Reason for CRM: Patient says office called  and there was no message left on machine , please call again . She would like to know how may types of shingles shots there are, and she also states she had lab work done last week and thinks that is the reason for the call , please call her at  [phone]

## 2018-09-23 NOTE — Telephone Encounter (Signed)
Susan Richard did you call patient  Please advise

## 2019-12-15 ENCOUNTER — Other Ambulatory Visit: Payer: Self-pay

## 2019-12-20 ENCOUNTER — Ambulatory Visit (INDEPENDENT_AMBULATORY_CARE_PROVIDER_SITE_OTHER): Payer: 59 | Admitting: Family Medicine

## 2019-12-20 ENCOUNTER — Other Ambulatory Visit: Payer: Self-pay

## 2019-12-20 VITALS — BP 114/50 | HR 87 | Temp 96.5°F | Resp 16 | Ht 60.0 in | Wt 116.0 lb

## 2019-12-20 DIAGNOSIS — Z0000 Encounter for general adult medical examination without abnormal findings: Secondary | ICD-10-CM

## 2019-12-20 DIAGNOSIS — Z23 Encounter for immunization: Secondary | ICD-10-CM | POA: Diagnosis not present

## 2019-12-20 DIAGNOSIS — Z1239 Encounter for other screening for malignant neoplasm of breast: Secondary | ICD-10-CM

## 2019-12-20 DIAGNOSIS — Z1211 Encounter for screening for malignant neoplasm of colon: Secondary | ICD-10-CM | POA: Diagnosis not present

## 2019-12-20 DIAGNOSIS — E782 Mixed hyperlipidemia: Secondary | ICD-10-CM | POA: Diagnosis not present

## 2019-12-20 DIAGNOSIS — D649 Anemia, unspecified: Secondary | ICD-10-CM

## 2019-12-20 NOTE — Assessment & Plan Note (Signed)
Patient encouraged to maintain heart healthy diet, regular exercise, adequate sleep. Consider daily probiotics. Take medications as prescribed. Labs ordered and reviewed. She is nervous to get her MGM now but agress to get in the next few months so it is ordered . Also nervous to pursue colonoscopy but agrees to consultation in the summer so referral place. Pap due in 2022.

## 2019-12-20 NOTE — Addendum Note (Signed)
Addended by: Jiles Prows on: 12/20/2019 10:46 AM   Modules accepted: Orders

## 2019-12-20 NOTE — Patient Instructions (Signed)
[employer] Blood Pressure cuff, upper arm, want BP 100-140/60-90 Pulse oximeter, want oxygen in 90s  Weekly vitals  Take Multivitamin with minerals, selenium Vitamin D 1000-2000 IU daily Probiotic with lactobacillus and bifidophilus Asprin EC 81 mg daily  Melatonin 2-5 mg at bedtime  [email or web address]   Preventive Care 40-52 Years Old, Female Preventive care refers to visits with your health care provider and lifestyle choices that can promote health and wellness. This includes:  A yearly physical exam. This may also be called an annual well check.  Regular dental visits and eye exams.  Immunizations.  Screening for certain conditions.  Healthy lifestyle choices, such as eating a healthy diet, getting regular exercise, not using drugs or products that contain nicotine and tobacco, and limiting alcohol use. What can I expect for my preventive care visit? Physical exam Your health care provider will check your:  Height and weight. This may be used to calculate body mass index (BMI), which tells if you are at a healthy weight.  Heart rate and blood pressure.  Skin for abnormal spots. Counseling Your health care provider may ask you questions about your:  Alcohol, tobacco, and drug use.  Emotional well-being.  Home and relationship well-being.  Sexual activity.  Eating habits.  Work and work environment.  Method of birth control.  Menstrual cycle.  Pregnancy history. What immunizations do I need?  Influenza (flu) vaccine  This is recommended every year. Tetanus, diphtheria, and pertussis (Tdap) vaccine  You may need a Td booster every 10 years. Varicella (chickenpox) vaccine  You may need this if you have not been vaccinated. Zoster (shingles) vaccine  You may need this after age 60. Measles, mumps, and rubella (MMR) vaccine  You may need at least one dose of MMR if you were born in 1957 or later. You may also need a  second dose. Pneumococcal conjugate (PCV13) vaccine  You may need this if you have certain conditions and were not previously vaccinated. Pneumococcal polysaccharide (PPSV23) vaccine  You may need one or two doses if you smoke cigarettes or if you have certain conditions. Meningococcal conjugate (MenACWY) vaccine  You may need this if you have certain conditions. Hepatitis A vaccine  You may need this if you have certain conditions or if you travel or work in places where you may be exposed to hepatitis A. Hepatitis B vaccine  You may need this if you have certain conditions or if you travel or work in places where you may be exposed to hepatitis B. Haemophilus influenzae type b (Hib) vaccine  You may need this if you have certain conditions. Human papillomavirus (HPV) vaccine  If recommended by your health care provider, you may need three doses over 6 months. You may receive vaccines as individual doses or as more than one vaccine together in one shot (combination vaccines). Talk with your health care provider about the risks and benefits of combination vaccines. What tests do I need? Blood tests  Lipid and cholesterol levels. These may be checked every 5 years, or more frequently if you are over 50 years old.  Hepatitis C test.  Hepatitis B test. Screening  Lung cancer screening. You may have this screening every year starting at age 55 if you have a 30-pack-year history of smoking and currently smoke or have quit within the past 15 years.  Colorectal cancer screening. All adults should have this screening starting at age 50 and continuing until age 75. Your health care provider may recommend screening at   age 45 if you are at increased risk. You will have tests every 1-10 years, depending on your results and the type of screening test.  Diabetes screening. This is done by checking your blood sugar (glucose) after you have not eaten for a while (fasting). You may have this done  every 1-3 years.  Mammogram. This may be done every 1-2 years. Talk with your health care provider about when you should start having regular mammograms. This may depend on whether you have a family history of breast cancer.  BRCA-related cancer screening. This may be done if you have a family history of breast, ovarian, tubal, or peritoneal cancers.  Pelvic exam and Pap test. This may be done every 3 years starting at age 21. Starting at age 30, this may be done every 5 years if you have a Pap test in combination with an HPV test. Other tests  Sexually transmitted disease (STD) testing.  Bone density scan. This is done to screen for osteoporosis. You may have this scan if you are at high risk for osteoporosis. Follow these instructions at home: Eating and drinking  Eat a diet that includes fresh fruits and vegetables, whole grains, lean protein, and low-fat dairy.  Take vitamin and mineral supplements as recommended by your health care provider.  Do not drink alcohol if: ? Your health care provider tells you not to drink. ? You are pregnant, may be pregnant, or are planning to become pregnant.  If you drink alcohol: ? Limit how much you have to 0-1 drink a day. ? Be aware of how much alcohol is in your drink. In the [place] one drink equals one 12 oz bottle of beer (355 mL), one 5 oz glass of wine (148 mL), or one 1 oz glass of hard liquor (44 mL). Lifestyle  Take daily care of your teeth and gums.  Stay active. Exercise for at least 30 minutes on 5 or more days each week.  Do not use any products that contain nicotine or tobacco, such as cigarettes, e-cigarettes, and chewing tobacco. If you need help quitting, ask your health care provider.  If you are sexually active, practice safe sex. Use a condom or other form of birth control (contraception) in order to prevent pregnancy and STIs (sexually transmitted infections).  If told by your health care provider, take low-dose aspirin  daily starting at age 50. What's next?  Visit your health care provider once a year for a well check visit.  Ask your health care provider how often you should have your eyes and teeth checked.  Stay up to date on all vaccines. This information is not intended to replace advice given to you by your health care provider. Make sure you discuss any questions you have with your health care provider. Document Revised: 07/01/2018 Document Reviewed: 07/01/2018 Elsevier Patient Education  2020 [employer] Inc.  

## 2019-12-20 NOTE — Assessment & Plan Note (Signed)
Encouraged heart healthy diet, increase exercise, avoid trans fats, consider a krill oil cap daily 

## 2019-12-20 NOTE — Assessment & Plan Note (Addendum)
Increase leafy greens, consider increased lean red meat and using cast iron cookware. Continue to monitor, report any concerns. She had experienced very heavy menstrual bleeding last year prior to her lab work. Will recheck labs today

## 2019-12-20 NOTE — Progress Notes (Signed)
Subjective:    Patient ID: Susan Richard, female    DOB: 10-09-1966, 54 y.o.   MRN: [id number]  Chief Complaint  Patient presents with   Annual Exam    doing well    HPI Patient is in today for annual preventative exam. She denies any recent febrile illness or hospitalizations. She is exercising 2 hours daily since she quit her job and has been eating healthier lately with increased fruits and veg. Denies CP/palp/SOB/HA/congestion/fevers/GI or GU c/o. Taking meds as prescribed  Past Medical History:  Diagnosis Date   Cervical cancer screening 03/31/2012   Chicken pox as a child   Hyperlipidemia, mixed 05/07/2015   Preventative health care 03/31/2012    Past Surgical History:  Procedure Laterality Date   [place] and 1997   X 2    No family history on file.  Social History   Socioeconomic History   Marital status: Married    Spouse name: Not on file   Number of children: Not on file   Years of education: Not on file   Highest education level: Not on file  Occupational History   Occupation: Hair stylist  Tobacco Use   Smoking status: Never Smoker   Smokeless tobacco: Never Used  Substance and Sexual Activity   Alcohol use: No   Drug use: No   Sexual activity: Not Currently  Other Topics Concern   Not on file  Social History Narrative   Not on file   Social Determinants of Health   Financial Resource Strain:    Difficulty of Paying Living Expenses: Not on file  Food Insecurity:    Worried About [place] in the Last Year: Not on file   Ran Out of Food in the Last Year: Not on file  Transportation Needs:    Lack of Transportation (Medical): Not on file   Lack of Transportation (Non-Medical): Not on file  Physical Activity:    Days of Exercise per Week: Not on file   Minutes of Exercise per Session: Not on file  Stress:    Feeling of Stress : Not on file  Social Connections:    Frequency of Communication  with Friends and Family: Not on file   Frequency of Social Gatherings with Friends and Family: Not on file   Attends Religious Services: Not on file   Active Member of Clubs or Organizations: Not on file   Attends Archivist Meetings: Not on file   Marital Status: Not on file  Intimate Partner Violence:    Fear of Current or Ex-Partner: Not on file   Emotionally Abused: Not on file   Physically Abused: Not on file   Sexually Abused: Not on file    No outpatient medications prior to visit.   No facility-administered medications prior to visit.    No Known Allergies  Review of Systems  Constitutional: Negative for chills, fever and malaise/fatigue.  HENT: Negative for congestion and hearing loss.   Eyes: Negative for discharge.  Respiratory: Negative for cough, sputum production and shortness of breath.   Cardiovascular: Negative for chest pain, palpitations and leg swelling.  Gastrointestinal: Negative for abdominal pain, blood in stool, constipation, diarrhea, heartburn, nausea and vomiting.  Genitourinary: Negative for dysuria, frequency, hematuria and urgency.  Musculoskeletal: Negative for back pain, falls and myalgias.  Skin: Negative for rash.  Neurological: Negative for dizziness, sensory change, loss of consciousness, weakness and headaches.  Endo/Heme/Allergies: Negative for environmental allergies. Does not  bruise/bleed easily.  Psychiatric/Behavioral: Negative for depression and suicidal ideas. The patient is not nervous/anxious and does not have insomnia.        Objective:    Physical Exam Constitutional:      General: She is not in acute distress.    Appearance: She is well-developed.  HENT:     Head: Normocephalic and atraumatic.  Eyes:     Conjunctiva/sclera: Conjunctivae normal.  Neck:     Thyroid: No thyromegaly.  Cardiovascular:     Rate and Rhythm: Normal rate and regular rhythm.     Heart sounds: Normal heart sounds. No murmur.    Pulmonary:     Effort: Pulmonary effort is normal. No respiratory distress.     Breath sounds: Normal breath sounds.  Abdominal:     General: Bowel sounds are normal. There is no distension.     Palpations: Abdomen is soft. There is no mass.     Tenderness: There is no abdominal tenderness.  Musculoskeletal:     Cervical back: Neck supple.  Lymphadenopathy:     Cervical: No cervical adenopathy.  Skin:    General: Skin is warm and dry.  Neurological:     Mental Status: She is alert and oriented to person, place, and time.  Psychiatric:        Behavior: Behavior normal.     BP (!) 114/50 (BP Location: Left Arm, Patient Position: Sitting, Cuff Size: Small)    Pulse 87    Temp (!) 96.5 F (35.8 C) (Temporal)    Resp 16    Ht 5' (1.524 m)    Wt 116 lb (52.6 kg)    BMI 22.65 kg/m  Wt Readings from Last 3 Encounters:  12/20/19 116 lb (52.6 kg)  09/06/18 112 lb (50.8 kg)  07/14/17 110 lb 6.4 oz (50.1 kg)    Diabetic Foot Exam - Simple   No data filed     Lab Results  Component Value Date   WBC 6.1 09/06/2018   HGB 10.5 (L) 09/06/2018   HCT 32.7 (L) 09/06/2018   PLT 308.0 09/06/2018   GLUCOSE 82 09/06/2018   CHOL 198 09/06/2018   TRIG 79.0 09/06/2018   HDL 64.90 09/06/2018   LDLCALC 117 (H) 09/06/2018   ALT 12 09/06/2018   AST 19 09/06/2018   NA 138 09/06/2018   K 4.3 09/06/2018   CL 104 09/06/2018   CREATININE 0.65 09/06/2018   BUN 13 09/06/2018   CO2 26 09/06/2018   TSH 1.54 09/06/2018    Lab Results  Component Value Date   TSH 1.54 09/06/2018   Lab Results  Component Value Date   WBC 6.1 09/06/2018   HGB 10.5 (L) 09/06/2018   HCT 32.7 (L) 09/06/2018   MCV 75.2 (L) 09/06/2018   PLT 308.0 09/06/2018   Lab Results  Component Value Date   NA 138 09/06/2018   K 4.3 09/06/2018   CO2 26 09/06/2018   GLUCOSE 82 09/06/2018   BUN 13 09/06/2018   CREATININE 0.65 09/06/2018   BILITOT 0.4 09/06/2018   ALKPHOS 36 (L) 09/06/2018   AST 19 09/06/2018   ALT 12  09/06/2018   PROT 7.3 09/06/2018   ALBUMIN 4.5 09/06/2018   CALCIUM 9.1 09/06/2018   GFR 102.51 09/06/2018   Lab Results  Component Value Date   CHOL 198 09/06/2018   Lab Results  Component Value Date   HDL 64.90 09/06/2018   Lab Results  Component Value Date   LDLCALC 117 (H) 09/06/2018  Lab Results  Component Value Date   TRIG 79.0 09/06/2018   Lab Results  Component Value Date   CHOLHDL 3 09/06/2018   No results found for: HGBA1C     Assessment & Plan:   Problem List Items Addressed This Visit    Preventative health care    Patient encouraged to maintain heart healthy diet, regular exercise, adequate sleep. Consider daily probiotics. Take medications as prescribed. Labs ordered and reviewed. She is nervous to get her MGM now but agress to get in the next few months so it is ordered . Also nervous to pursue colonoscopy but agrees to consultation in the summer so referral place. Pap due in 2022.       Relevant Orders   Hemoglobin A1c   CBC   Comprehensive metabolic panel   TSH   Hyperlipidemia, mixed - Primary    Encouraged heart healthy diet, increase exercise, avoid trans fats, consider a krill oil cap daily      Relevant Orders   Hemoglobin A1c   Lipid panel   Anemia    Increase leafy greens, consider increased lean red meat and using cast iron cookware. Continue to monitor, report any concerns. She had experienced very heavy menstrual bleeding last year prior to her lab work. Will recheck labs today       Other Visit Diagnoses    Colon cancer screening       Relevant Orders   Ambulatory referral to Gastroenterology   Encounter for screening for malignant neoplasm of breast, unspecified screening modality       Relevant Orders   MM DIAG BREAST TOMO BILATERAL      Susan Richard does not currently have medications on file.  No orders of the defined types were placed in this encounter.    Penni Homans, MD

## 2019-12-22 LAB — LIPID PANEL
Cholesterol: 220 mg/dL — ABNORMAL HIGH (ref <200)
HDL: 70 mg/dL (ref >=50)
LDL Cholesterol (Calc): 134 mg/dL (calc) — ABNORMAL HIGH
Non-HDL Cholesterol (Calc): 150 mg/dL (calc) — ABNORMAL HIGH (ref <130)
Total CHOL/HDL Ratio: 3.1 (calc) (ref <5.0)
Triglycerides: 72 mg/dL (ref <150)

## 2019-12-22 LAB — COMPREHENSIVE METABOLIC PANEL
AG Ratio: 1.6 (calc) (ref 1.0–2.5)
ALT: 16 U/L (ref 6–29)
AST: 23 U/L (ref 10–35)
Albumin: 4.5 g/dL (ref 3.6–5.1)
Alkaline phosphatase (APISO): 40 U/L (ref 37–153)
BUN: 10 mg/dL (ref 7–25)
CO2: 26 mmol/L (ref 20–32)
Calcium: 9.3 mg/dL (ref 8.6–10.4)
Chloride: 105 mmol/L (ref 98–110)
Creat: 0.72 mg/dL (ref 0.50–1.05)
Globulin: 2.9 g/dL (calc) (ref 1.9–3.7)
Glucose, Bld: 92 mg/dL (ref 65–99)
Potassium: 4.2 mmol/L (ref 3.5–5.3)
Sodium: 139 mmol/L (ref 135–146)
Total Bilirubin: 0.3 mg/dL (ref 0.2–1.2)
Total Protein: 7.4 g/dL (ref 6.1–8.1)

## 2019-12-22 LAB — CBC
HCT: 32 % — ABNORMAL LOW (ref 35.0–45.0)
Hemoglobin: 9.6 g/dL — ABNORMAL LOW (ref 11.7–15.5)
MCH: 21.9 pg — ABNORMAL LOW (ref 27.0–33.0)
MCHC: 30 g/dL — ABNORMAL LOW (ref 32.0–36.0)
MCV: 73.1 fL — ABNORMAL LOW (ref 80.0–100.0)
MPV: 10.5 fL (ref 7.5–12.5)
Platelets: 339 10*3/uL (ref 140–400)
RBC: 4.38 10*6/uL (ref 3.80–5.10)
RDW: 16.2 % — ABNORMAL HIGH (ref 11.0–15.0)
WBC: 4.7 10*3/uL (ref 3.8–10.8)

## 2019-12-22 LAB — IRON,TIBC AND FERRITIN PANEL
%SAT: 3 % (calc) — ABNORMAL LOW (ref 16–45)
Ferritin: 3 ng/mL — ABNORMAL LOW (ref 16–232)
Iron: 14 ug/dL — ABNORMAL LOW (ref 45–160)
TIBC: 418 mcg/dL (calc) (ref 250–450)

## 2019-12-22 LAB — TEST AUTHORIZATION

## 2019-12-22 LAB — TSH: TSH: 2.21 mIU/L

## 2019-12-22 LAB — HEMOGLOBIN A1C
Hgb A1c MFr Bld: 5.3 % of total Hgb (ref <5.7)
Mean Plasma Glucose: 105 (calc)
eAG (mmol/L): 5.8 (calc)

## 2019-12-30 ENCOUNTER — Telehealth: Payer: Self-pay

## 2019-12-30 NOTE — Telephone Encounter (Signed)
Patient called in to see if the Dr. Charlett Blake could give them a follow up call about a form that was filled out please call the patient at [phone]  Thanks,

## 2020-01-03 NOTE — Telephone Encounter (Signed)
Are you helping with paperwork, Please advise

## 2020-01-05 NOTE — Telephone Encounter (Signed)
Mailbox is full and cannot except messages.  Form was faxed and mailed to patient.

## 2020-01-06 NOTE — Telephone Encounter (Signed)
Spoke with patient and form needed dated on it.  Date put on form and refaxed.

## 2020-01-31 ENCOUNTER — Other Ambulatory Visit: Payer: Self-pay

## 2020-01-31 ENCOUNTER — Ambulatory Visit
Admission: RE | Admit: 2020-01-31 | Discharge: 2020-01-31 | Disposition: A | Discharge status: Home / Self Care | Payer: 59 | Source: Ambulatory Visit | Attending: Family Medicine | Admitting: Family Medicine

## 2020-01-31 DIAGNOSIS — Z1239 Encounter for other screening for malignant neoplasm of breast: Secondary | ICD-10-CM

## 2020-03-06 ENCOUNTER — Ambulatory Visit: Payer: 59

## 2020-03-20 ENCOUNTER — Ambulatory Visit (INDEPENDENT_AMBULATORY_CARE_PROVIDER_SITE_OTHER): Payer: 59

## 2020-03-20 ENCOUNTER — Other Ambulatory Visit: Payer: Self-pay

## 2020-03-20 ENCOUNTER — Ambulatory Visit: Payer: 59

## 2020-03-20 DIAGNOSIS — Z23 Encounter for immunization: Secondary | ICD-10-CM | POA: Diagnosis not present

## 2020-03-20 NOTE — Progress Notes (Signed)
Patient here today for shingrix vaccine. 0.5mL given in left deltoid IM. Patient tolerated well.  

## 2020-03-28 ENCOUNTER — Encounter: Payer: Self-pay | Admitting: Gastroenterology

## 2020-05-28 ENCOUNTER — Encounter: Payer: 59 | Admitting: Gastroenterology

## 2020-06-14 ENCOUNTER — Encounter: Payer: Self-pay | Admitting: Gastroenterology

## 2020-06-14 ENCOUNTER — Ambulatory Visit (AMBULATORY_SURGERY_CENTER): Payer: Self-pay | Admitting: *Deleted

## 2020-06-14 ENCOUNTER — Other Ambulatory Visit: Payer: Self-pay

## 2020-06-14 VITALS — Ht 60.0 in | Wt 116.0 lb

## 2020-06-14 DIAGNOSIS — Z1211 Encounter for screening for malignant neoplasm of colon: Secondary | ICD-10-CM

## 2020-06-14 MED ORDER — SUTAB 1479-225-188 MG PO TABS: 1.0000 | ORAL_TABLET | Freq: Once | ORAL | 0 refills | Status: AC

## 2020-06-14 NOTE — Progress Notes (Signed)

## 2020-06-17 IMAGING — MG DIGITAL SCREENING BILATERAL MAMMOGRAM WITH TOMO AND CAD
8 series · 9 of 24 positions shown · non-contrast
Comparison: Previous exam(s).

CLINICAL DATA: Screening.

EXAM:
DIGITAL SCREENING BILATERAL MAMMOGRAM WITH TOMO AND CAD

[R CC synth-2D]
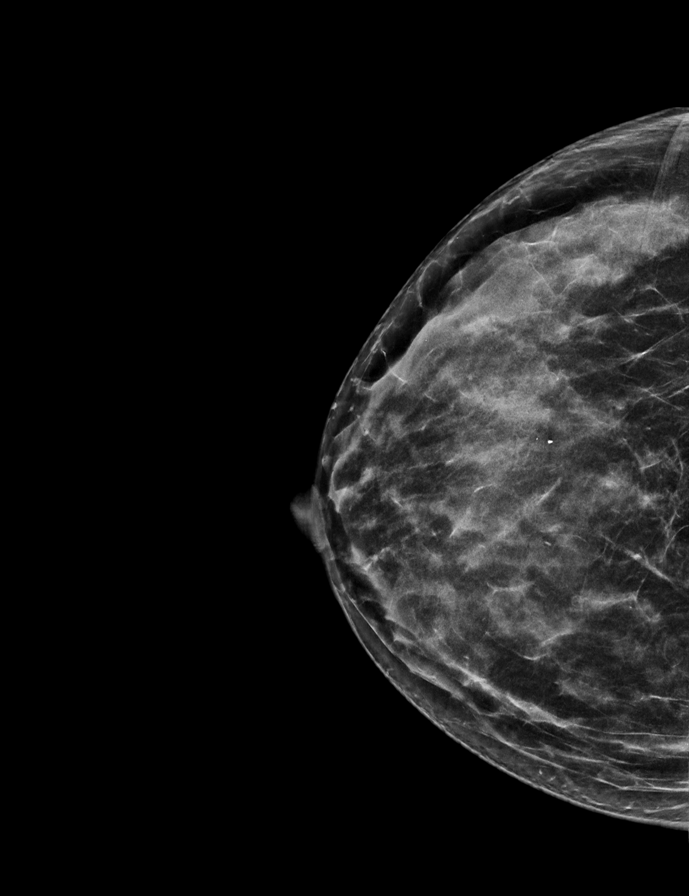

[L MLO synth-2D]
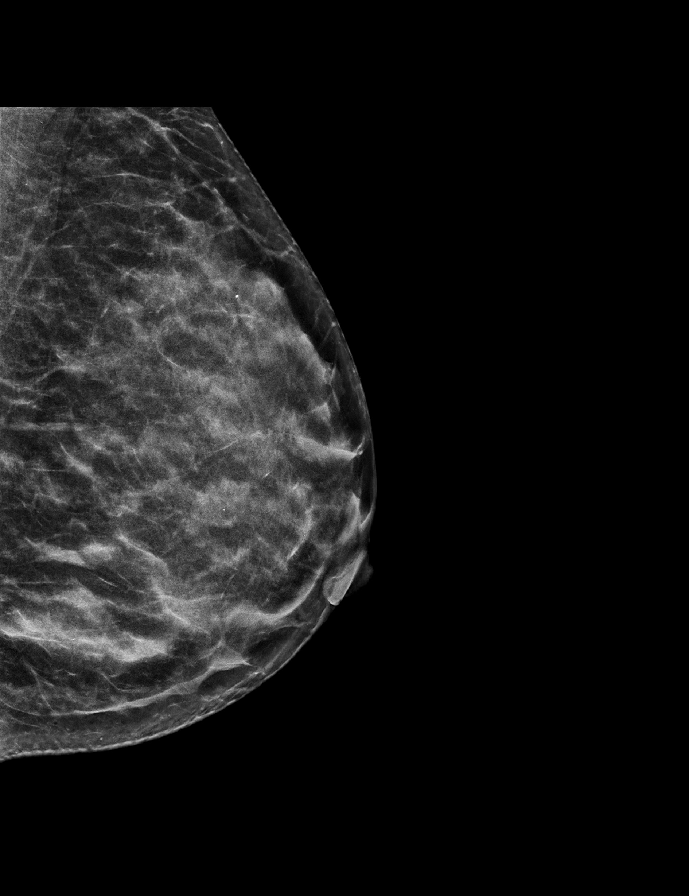

[R MLO synth-2D]
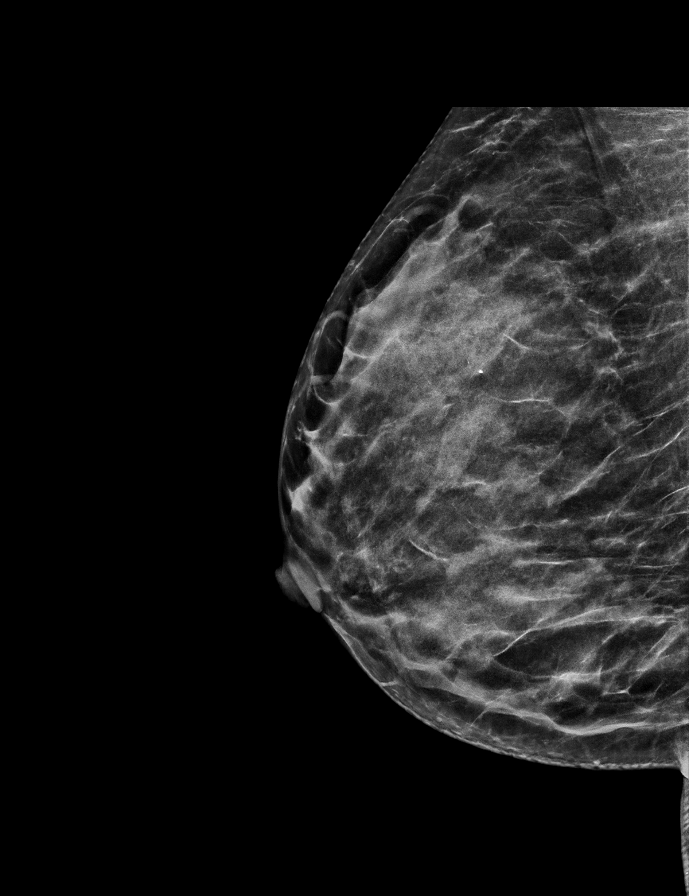

[L CC synth-2D]
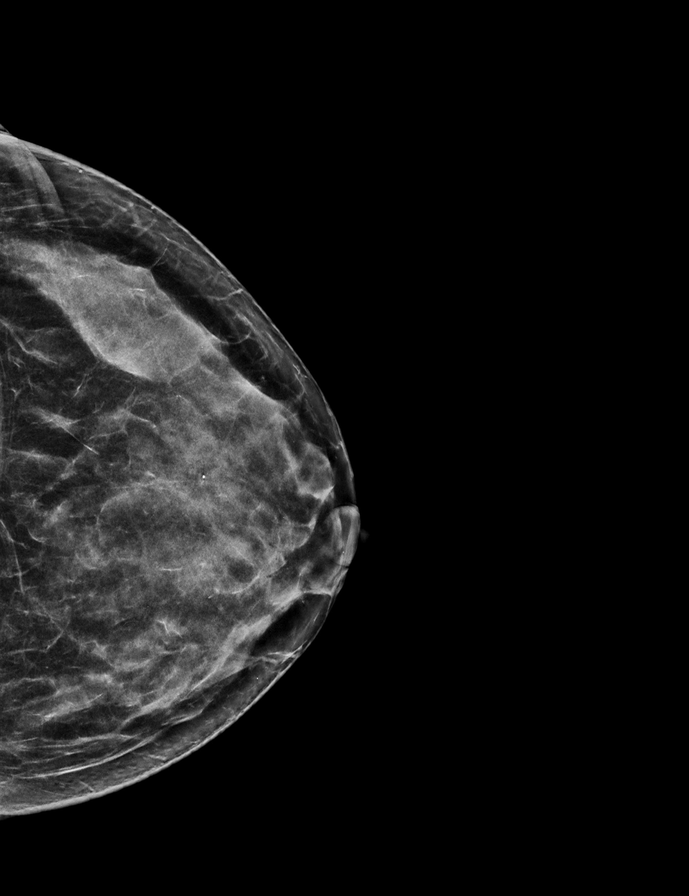

[L CC tomo · 2 of 58 frames shown]
[frame 19/58]
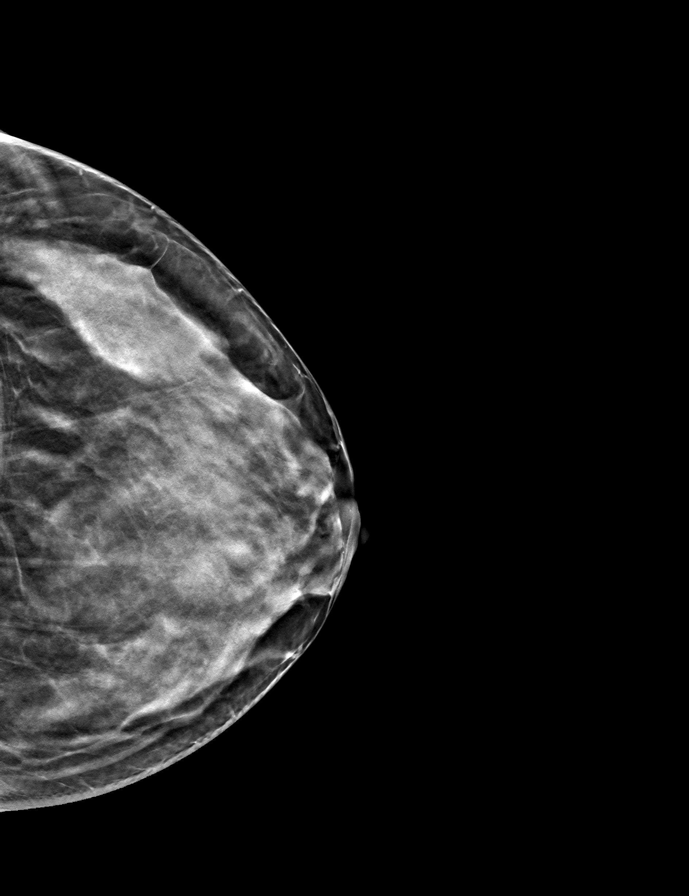
[frame 29/58]
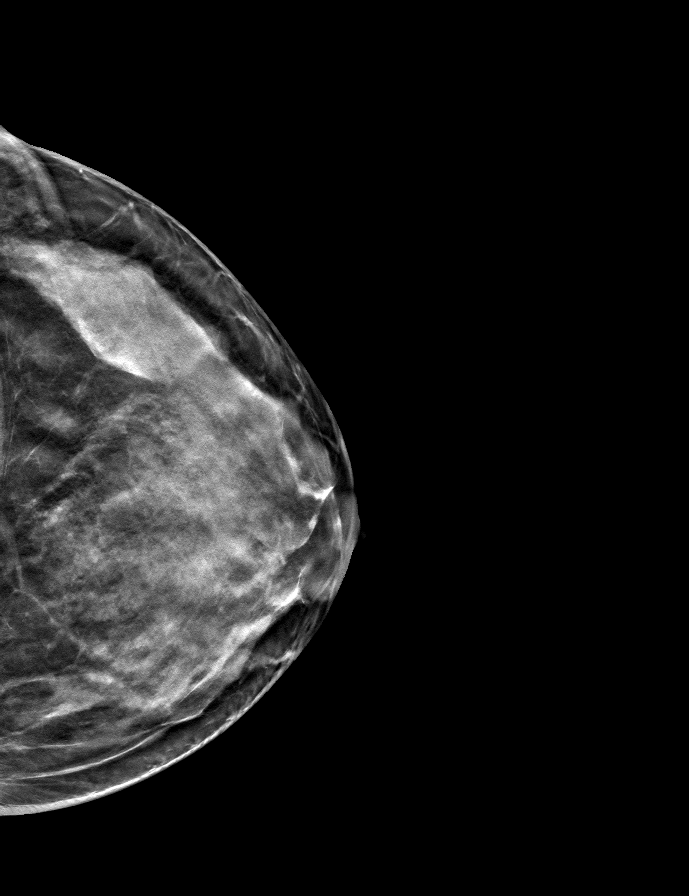

[R CC tomo · tomo slice 29/56.0]
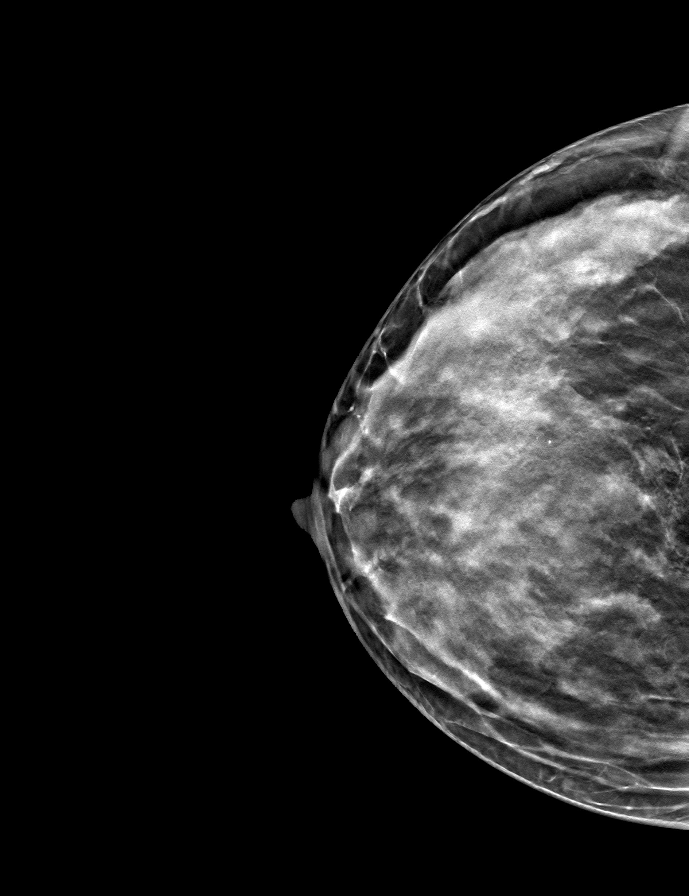

[R MLO tomo · tomo slice 29/56.0]
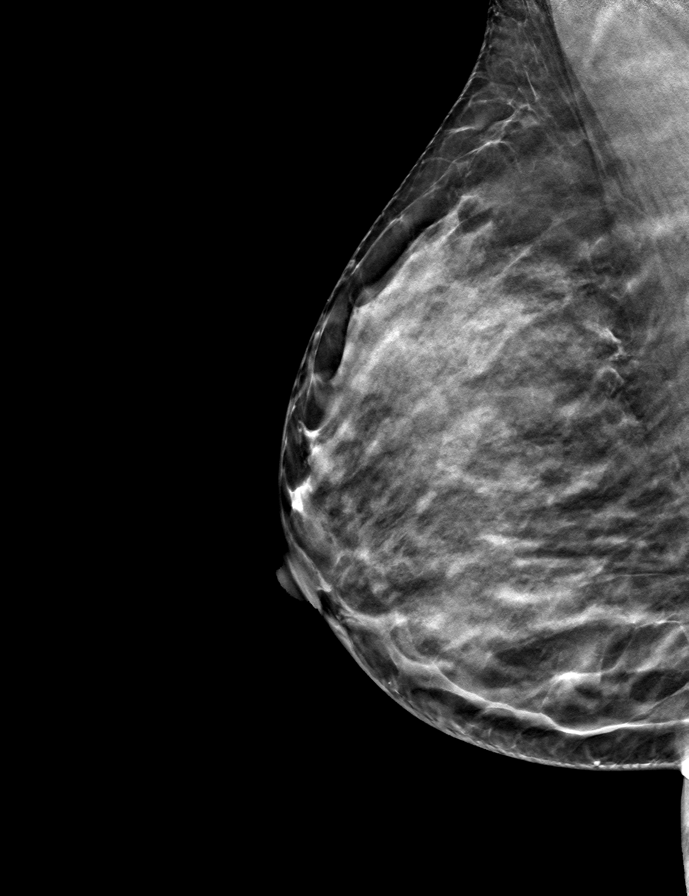

[L MLO tomo · tomo slice 27/54.0]
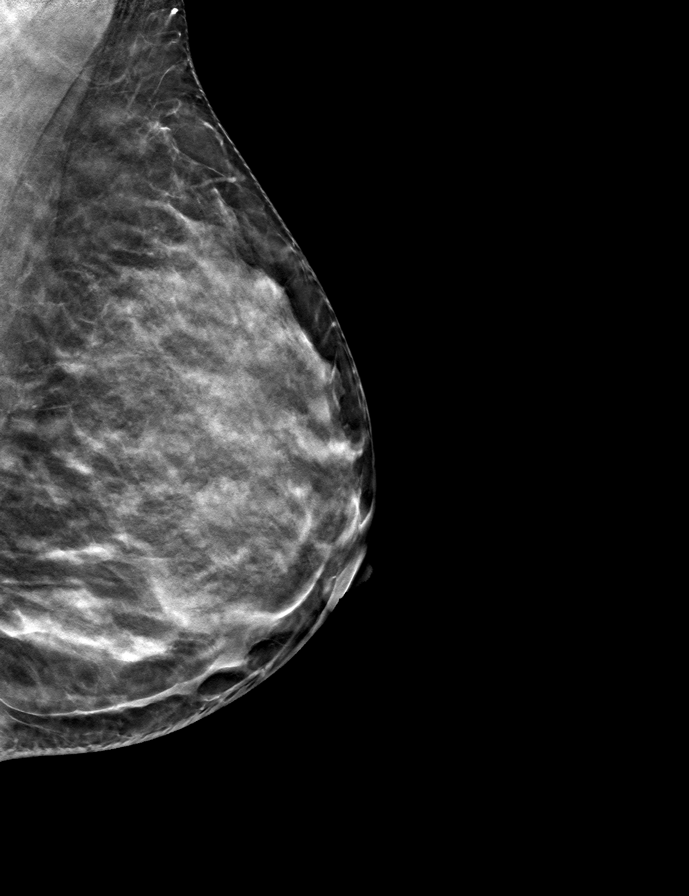

[9 of 24 positions shown; findings below may reference images not displayed]

ACR Breast Density Category c: The breast tissue is heterogeneously
dense, which may obscure small masses.
FINDINGS: There are no findings suspicious for malignancy. Images were
processed with CAD.
IMPRESSION: No mammographic evidence of malignancy. A result letter of this
screening mammogram will be mailed directly to the patient.

RECOMMENDATION:
Screening mammogram in one year. (Code:FT-U-LHB)

BI-RADS CATEGORY  1: Negative.

## 2020-06-25 ENCOUNTER — Telehealth: Payer: Self-pay | Admitting: Gastroenterology

## 2020-06-25 NOTE — Telephone Encounter (Signed)
Returned call to patient how wanted to verify the instructions, also that she had started he period.

## 2020-06-28 ENCOUNTER — Ambulatory Visit (AMBULATORY_SURGERY_CENTER): Payer: 59 | Admitting: Gastroenterology

## 2020-06-28 ENCOUNTER — Encounter: Payer: Self-pay | Admitting: Gastroenterology

## 2020-06-28 ENCOUNTER — Other Ambulatory Visit: Payer: Self-pay

## 2020-06-28 VITALS — BP 95/52 | HR 70 | Temp 97.5°F | Resp 17 | Ht 60.0 in | Wt 116.0 lb

## 2020-06-28 DIAGNOSIS — Z1211 Encounter for screening for malignant neoplasm of colon: Secondary | ICD-10-CM

## 2020-06-28 DIAGNOSIS — D125 Benign neoplasm of sigmoid colon: Secondary | ICD-10-CM

## 2020-06-28 HISTORY — PX: COLONOSCOPY: SHX174

## 2020-06-28 MED ORDER — SODIUM CHLORIDE 0.9 % IV SOLN: 500.0000 mL | INTRAVENOUS | Status: DC

## 2020-06-28 NOTE — Op Note (Addendum)
[facility] Patient Name: Susan Richard Procedure Date: 06/28/2020 7:52 AM MRN: [id number] Endoscopist: Thornton Park MD, MD Age: 54 Referring MD:  Date of Birth: 17-Jul-1966 Gender: Female Account #: [id number] Procedure:                Colonoscopy Indications:              Screening for colorectal malignant neoplasm, This                            is the patient's first colonoscopy                           No known family history of colon cancer or polyps Medicines:                Monitored Anesthesia Care Procedure:                Pre-Anesthesia Assessment:                           - Prior to the procedure, a History and Physical                            was performed, and patient medications and                            allergies were reviewed. The patient's tolerance of                            previous anesthesia was also reviewed. The risks                            and benefits of the procedure and the sedation                            options and risks were discussed with the patient.                            All questions were answered, and informed consent                            was obtained. Prior Anticoagulants: The patient has                            taken no previous anticoagulant or antiplatelet                            agents. ASA Grade Assessment: II - A patient with                            mild systemic disease. After reviewing the risks                            and benefits, the patient was deemed in  satisfactory condition to undergo the procedure.                           After obtaining informed consent, the colonoscope                            was passed under direct vision. Throughout the                            procedure, the patient's blood pressure, pulse, and                            oxygen saturations were monitored continuously. The                            Colonoscope was introduced  through the anus and                            advanced to the 4 cm into the ileum. A second                            forward view of the right colon was performed. The                            colonoscopy was performed without difficulty. The                            patient tolerated the procedure well. The quality                            of the bowel preparation was good. The terminal                            ileum, ileocecal valve, appendiceal orifice, and                            rectum were photographed. Scope In: 7:58:33 AM Scope Out: 8:12:35 AM Scope Withdrawal Time: 0 hours 11 minutes 9 seconds  Total Procedure Duration: 0 hours 14 minutes 2 seconds  Findings:                 The perianal and digital rectal examinations were                            normal.                           A 1 mm polyp was found in the sigmoid colon. The                            polyp was sessile. The polyp was removed with a                            cold snare. Resection and retrieval were complete.  Estimated blood loss was minimal.                           Non-bleeding internal hemorrhoids were found.                           The exam was otherwise without abnormality on                            direct and retroflexion views. Complications:            No immediate complications. Estimated blood loss:                            Minimal. Estimated Blood Loss:     Estimated blood loss was minimal. Impression:               - One 1 mm polyp in the sigmoid colon, removed with                            a cold snare. Resected and retrieved.                           - Non-bleeding internal hemorrhoids.                           - The examination was otherwise normal on direct                            and retroflexion views. Recommendation:           - Patient has a contact number available for                            emergencies. The signs and  symptoms of potential                            delayed complications were discussed with the                            patient. Return to normal activities tomorrow.                            Written discharge instructions were provided to the                            patient.                           - Resume previous diet.                           - Continue present medications.                           - Await pathology results.                           -  Repeat colonoscopy date to be determined after                            pending pathology results are reviewed for                            surveillance.                           - Emerging evidence supports eating a diet of                            fruits, vegetables, grains, calcium, and yogurt                            while reducing red meat and alcohol may reduce the                            risk of colon cancer.                           - Thank you for allowing me to be involved in your                            colon cancer prevention. Thornton Park MD, MD 06/28/2020 8:16:35 AM This report has been signed electronically.

## 2020-06-28 NOTE — Progress Notes (Signed)
A and O x3. Report to RN. Tolerated MAC anesthesia well.

## 2020-06-28 NOTE — Progress Notes (Signed)
Vital signs checked by:CW ? ?The medical and surgical history was reviewed and verified with the patient. ? ?

## 2020-06-28 NOTE — Patient Instructions (Signed)
YOU HAD AN ENDOSCOPIC PROCEDURE TODAY AT THE [facility]:   Refer to the procedure report that was given to you for any specific questions about what was found during the examination.  If the procedure report does not answer your questions, please call your gastroenterologist to clarify.  If you requested that your care partner not be given the details of your procedure findings, then the procedure report has been included in a sealed envelope for you to review at your convenience later.  YOU SHOULD EXPECT: Some feelings of bloating in the abdomen. Passage of more gas than usual.  Walking can help get rid of the air that was put into your GI tract during the procedure and reduce the bloating. If you had a lower endoscopy (such as a colonoscopy or flexible sigmoidoscopy) you may notice spotting of blood in your stool or on the toilet paper. If you underwent a bowel prep for your procedure, you may not have a normal bowel movement for a few days.  Please Note:  You might notice some irritation and congestion in your nose or some drainage.  This is from the oxygen used during your procedure.  There is no need for concern and it should clear up in a day or so.  SYMPTOMS TO REPORT IMMEDIATELY:   Following lower endoscopy (colonoscopy or flexible sigmoidoscopy):  Excessive amounts of blood in the stool  Significant tenderness or worsening of abdominal pains  Swelling of the abdomen that is new, acute  Fever of 100F or higher  For urgent or emergent issues, a gastroenterologist can be reached at any hour by calling [phone]. Do not use MyChart messaging for urgent concerns.    DIET:  We do recommend a small meal at first, but then you may proceed to your regular diet.  Drink plenty of fluids but you should avoid alcoholic beverages for 24 hours.  ACTIVITY:  You should plan to take it easy for the rest of today and you should NOT DRIVE or use heavy machinery until tomorrow (because  of the sedation medicines used during the test).    FOLLOW UP: Our staff will call the number listed on your records 48-72 hours following your procedure to check on you and address any questions or concerns that you may have regarding the information given to you following your procedure. If we do not reach you, we will leave a message.  We will attempt to reach you two times.  During this call, we will ask if you have developed any symptoms of COVID 19. If you develop any symptoms (ie: fever, flu-like symptoms, shortness of breath, cough etc.) before then, please call [phone].  If you test positive for Covid 19 in the 2 weeks post procedure, please call and report this information to us.    If any biopsies were taken you will be contacted by phone or by letter within the next 1-3 weeks.  Please call us at [phone] if you have not heard about the biopsies in 3 weeks.    SIGNATURES/CONFIDENTIALITY: You and/or your care partner have signed paperwork which will be entered into your electronic medical record.  These signatures attest to the fact that that the information above on your After Visit Summary has been reviewed and is understood.  Full responsibility of the confidentiality of this discharge information lies with you and/or your care-partner. 

## 2020-06-28 NOTE — Progress Notes (Signed)
Called to room to assist during endoscopic procedure.  Patient ID and intended procedure confirmed with present staff. Received instructions for my participation in the procedure from the performing physician.  

## 2020-07-02 ENCOUNTER — Telehealth: Payer: Self-pay

## 2020-07-02 NOTE — Telephone Encounter (Signed)
  Follow up Call-  Call back number 06/28/2020  Post procedure Call Back phone  # [phone]  Permission to leave phone message Yes  Some recent data might be hidden     Patient questions:  Do you have a fever, pain , or abdominal swelling? No. Pain Score  0 *  Have you tolerated food without any problems? Yes.    Have you been able to return to your normal activities? Yes.    Do you have any questions about your discharge instructions: Diet   No. Medications  No. Follow up visit  No.  Do you have questions or concerns about your Care? No.  Actions: * If pain score is 4 or above: No action needed, pain <4.  1. Have you developed a fever since your procedure? no  2.   Have you had an respiratory symptoms (SOB or cough) since your procedure? no  3.   Have you tested positive for COVID 19 since your procedure no  4.   Have you had any family members/close contacts diagnosed with the COVID 19 since your procedure?  no   If yes to any of these questions please route to Joylene John, RN and Joella Prince, RN

## 2020-07-05 ENCOUNTER — Encounter: Payer: Self-pay | Admitting: Gastroenterology

## 2021-02-28 ENCOUNTER — Telehealth: Payer: Self-pay | Admitting: Family Medicine

## 2021-02-28 ENCOUNTER — Encounter: Payer: Self-pay | Admitting: Family Medicine

## 2021-02-28 ENCOUNTER — Ambulatory Visit (INDEPENDENT_AMBULATORY_CARE_PROVIDER_SITE_OTHER): Payer: 59 | Admitting: Family Medicine

## 2021-02-28 ENCOUNTER — Other Ambulatory Visit: Payer: Self-pay

## 2021-02-28 VITALS — BP 108/60 | HR 76 | Temp 97.6°F | Resp 16 | Ht 60.0 in | Wt 109.9 lb

## 2021-02-28 DIAGNOSIS — Z1231 Encounter for screening mammogram for malignant neoplasm of breast: Secondary | ICD-10-CM | POA: Diagnosis not present

## 2021-02-28 DIAGNOSIS — E782 Mixed hyperlipidemia: Secondary | ICD-10-CM

## 2021-02-28 DIAGNOSIS — D649 Anemia, unspecified: Secondary | ICD-10-CM

## 2021-02-28 DIAGNOSIS — Z0000 Encounter for general adult medical examination without abnormal findings: Secondary | ICD-10-CM

## 2021-02-28 DIAGNOSIS — N951 Menopausal and female climacteric states: Secondary | ICD-10-CM

## 2021-02-28 LAB — LIPID PANEL
Cholesterol: 199 mg/dL (ref 0–200)
HDL: 70.6 mg/dL (ref >39.00)
LDL Cholesterol: 118 mg/dL — ABNORMAL HIGH (ref 0–99)
NonHDL: 128.39
Total CHOL/HDL Ratio: 3
Triglycerides: 54 mg/dL (ref 0.0–149.0)
VLDL: 10.8 mg/dL (ref 0.0–40.0)

## 2021-02-28 LAB — COMPREHENSIVE METABOLIC PANEL
ALT: 32 U/L (ref 0–35)
AST: 33 U/L (ref 0–37)
Albumin: 4.3 g/dL (ref 3.5–5.2)
Alkaline Phosphatase: 45 U/L (ref 39–117)
BUN: 17 mg/dL (ref 6–23)
CO2: 29 mEq/L (ref 19–32)
Calcium: 9.2 mg/dL (ref 8.4–10.5)
Chloride: 104 mEq/L (ref 96–112)
Creatinine, Ser: 0.7 mg/dL (ref 0.40–1.20)
GFR: 99.33 mL/min (ref >60.00)
Glucose, Bld: 88 mg/dL (ref 70–99)
Potassium: 4.4 mEq/L (ref 3.5–5.1)
Sodium: 138 mEq/L (ref 135–145)
Total Bilirubin: 0.3 mg/dL (ref 0.2–1.2)
Total Protein: 7.2 g/dL (ref 6.0–8.3)

## 2021-02-28 LAB — CBC WITH DIFFERENTIAL/PLATELET
Basophils Absolute: 0 10*3/uL (ref 0.0–0.1)
Basophils Relative: 0.5 % (ref 0.0–3.0)
Eosinophils Absolute: 0.1 10*3/uL (ref 0.0–0.7)
Eosinophils Relative: 1.3 % (ref 0.0–5.0)
HCT: 34.5 % — ABNORMAL LOW (ref 36.0–46.0)
Hemoglobin: 10.8 g/dL — ABNORMAL LOW (ref 12.0–15.0)
Lymphocytes Relative: 44 % (ref 12.0–46.0)
Lymphs Abs: 1.8 10*3/uL (ref 0.7–4.0)
MCHC: 31.3 g/dL (ref 30.0–36.0)
MCV: 71.3 fl — ABNORMAL LOW (ref 78.0–100.0)
Monocytes Absolute: 0.4 10*3/uL (ref 0.1–1.0)
Monocytes Relative: 9.2 % (ref 3.0–12.0)
Neutro Abs: 1.8 10*3/uL (ref 1.4–7.7)
Neutrophils Relative %: 45 % (ref 43.0–77.0)
Platelets: 222 10*3/uL (ref 150.0–400.0)
RBC: 4.84 Mil/uL (ref 3.87–5.11)
RDW: 18.2 % — ABNORMAL HIGH (ref 11.5–15.5)
WBC: 4 10*3/uL (ref 4.0–10.5)

## 2021-02-28 LAB — HEMOGLOBIN A1C: Hgb A1c MFr Bld: 5.7 % (ref 4.6–6.5)

## 2021-02-28 LAB — TSH: TSH: 2.82 u[IU]/mL (ref 0.35–4.50)

## 2021-02-28 NOTE — Telephone Encounter (Signed)
Pt dropped off document to be filled out ([employer] form 2 pages) Pt would like document to be faxed to [phone] and also to send a pt a copy of document to pt's home address. Document put at front office tray under providers name.

## 2021-02-28 NOTE — Assessment & Plan Note (Signed)
Encouraged heart healthy diet, increase exercise, avoid trans fats, consider a krill oil cap daily 

## 2021-02-28 NOTE — Assessment & Plan Note (Signed)
Patient encouraged to maintain heart healthy diet, regular exercise, adequate sleep. Consider daily probiotics. Take medications as prescribed. Labs ordered and reviewed today. MGM due in next 6 months. PAP w ith next physical colonoscopy done 8/26/202 repeat in 10 years

## 2021-02-28 NOTE — Assessment & Plan Note (Signed)
Occasional hot flashes and trouble sleeping, length and heaviness of cycles is starting to fluctuate some but still monthly. advised in sleep hygiene and need for low carb high protein diet and exercise

## 2021-02-28 NOTE — Progress Notes (Signed)
Subjective:    Patient ID: Susan Richard, female    DOB: [birth year], 55 y.o.   MRN: [id number]  Chief Complaint  Patient presents with  . Annual Exam    Pt has no problems or concerns     HPI Patient is in today for annual preventative exam. She is doing well today no recent febrile illness or hospitalizations. Occasional hot flashes and trouble sleeping, length and heaviness of cycles is starting to fluctuate some but still monthly. She exercises by doing yardwork and tries to maintain a heart healthy diet. She conrtinues to work with [employer]. Denies CP/palp/SOB/HA/congestion/fevers/GI or GU c/o. Taking meds as prescribed  Past Medical History:  Diagnosis Date  . Cervical cancer screening 03/31/2012  . Chicken pox as a child  . Hyperlipidemia, mixed 05/07/2015  . Preventative health care 03/31/2012    Past Surgical History:  Procedure Laterality Date  . CESAREAN SECTION  1995 and 1997   X 2  . COLONOSCOPY  06/28/2020    Family History  Problem Relation Age of Onset  . Colon cancer Neg Hx   . Esophageal cancer Neg Hx   . Rectal cancer Neg Hx   . Stomach cancer Neg Hx     Social History   Socioeconomic History  . Marital status: Married    Spouse name: Not on file  . Number of children: Not on file  . Years of education: Not on file  . Highest education level: Not on file  Occupational History  . Occupation: Hair stylist  Tobacco Use  . Smoking status: Never Smoker  . Smokeless tobacco: Never Used  Substance and Sexual Activity  . Alcohol use: No  . Drug use: No  . Sexual activity: Not Currently  Other Topics Concern  . Not on file  Social History Narrative  . Not on file   Social Determinants of Health   Financial Resource Strain: Not on file  Food Insecurity: Not on file  Transportation Needs: Not on file  Physical Activity: Not on file  Stress: Not on file  Social Connections: Not on file  Intimate Partner Violence: Not on file    No outpatient  medications prior to visit.   No facility-administered medications prior to visit.    No Known Allergies  Review of Systems  Constitutional: Negative for chills, fever and malaise/fatigue.  HENT: Negative for congestion and hearing loss.   Eyes: Negative for discharge.  Respiratory: Negative for cough, sputum production and shortness of breath.   Cardiovascular: Negative for chest pain, palpitations and leg swelling.  Gastrointestinal: Negative for abdominal pain, blood in stool, constipation, diarrhea, heartburn, nausea and vomiting.  Genitourinary: Negative for dysuria, frequency, hematuria and urgency.  Musculoskeletal: Negative for back pain, falls and myalgias.  Skin: Negative for rash.  Neurological: Negative for dizziness, sensory change, loss of consciousness, weakness and headaches.  Endo/Heme/Allergies: Negative for environmental allergies. Does not bruise/bleed easily.  Psychiatric/Behavioral: Negative for depression and suicidal ideas. The patient has insomnia. The patient is not nervous/anxious.        Objective:    Physical Exam Constitutional:      General: She is not in acute distress.    Appearance: She is well-developed.  HENT:     Head: Normocephalic and atraumatic.  Eyes:     Conjunctiva/sclera: Conjunctivae normal.  Neck:     Thyroid: No thyromegaly.  Cardiovascular:     Rate and Rhythm: Normal rate and regular rhythm.     Heart sounds: Normal  heart sounds. No murmur heard.   Pulmonary:     Effort: Pulmonary effort is normal. No respiratory distress.     Breath sounds: Normal breath sounds.  Abdominal:     General: Bowel sounds are normal. There is no distension.     Palpations: Abdomen is soft. There is no mass.     Tenderness: There is no abdominal tenderness.  Musculoskeletal:     Cervical back: Neck supple.  Lymphadenopathy:     Cervical: No cervical adenopathy.  Skin:    General: Skin is warm and dry.  Neurological:     Mental Status:  She is alert and oriented to person, place, and time.  Psychiatric:        Behavior: Behavior normal.     BP 108/60   Pulse 76   Temp 97.6 F (36.4 C)   Resp 16   Ht 5' (1.524 m)   Wt 109 lb 14.4 oz (49.9 kg)   SpO2 99%   BMI 21.46 kg/m  Wt Readings from Last 3 Encounters:  02/28/21 109 lb 14.4 oz (49.9 kg)  06/28/20 116 lb (52.6 kg)  06/14/20 116 lb (52.6 kg)    Diabetic Foot Exam - Simple   No data filed    Lab Results  Component Value Date   WBC 4.7 12/20/2019   HGB 9.6 (L) 12/20/2019   HCT 32.0 (L) 12/20/2019   PLT 339 12/20/2019   GLUCOSE 92 12/20/2019   CHOL 220 (H) 12/20/2019   TRIG 72 12/20/2019   HDL 70 12/20/2019   LDLCALC 134 (H) 12/20/2019   ALT 16 12/20/2019   AST 23 12/20/2019   NA 139 12/20/2019   K 4.2 12/20/2019   CL 105 12/20/2019   CREATININE 0.72 12/20/2019   BUN 10 12/20/2019   CO2 26 12/20/2019   TSH 2.21 12/20/2019   HGBA1C 5.3 12/20/2019    Lab Results  Component Value Date   TSH 2.21 12/20/2019   Lab Results  Component Value Date   WBC 4.7 12/20/2019   HGB 9.6 (L) 12/20/2019   HCT 32.0 (L) 12/20/2019   MCV 73.1 (L) 12/20/2019   PLT 339 12/20/2019   Lab Results  Component Value Date   NA 139 12/20/2019   K 4.2 12/20/2019   CO2 26 12/20/2019   GLUCOSE 92 12/20/2019   BUN 10 12/20/2019   CREATININE 0.72 12/20/2019   BILITOT 0.3 12/20/2019   ALKPHOS 36 (L) 09/06/2018   AST 23 12/20/2019   ALT 16 12/20/2019   PROT 7.4 12/20/2019   ALBUMIN 4.5 09/06/2018   CALCIUM 9.3 12/20/2019   GFR 102.51 09/06/2018   Lab Results  Component Value Date   CHOL 220 (H) 12/20/2019   Lab Results  Component Value Date   HDL 70 12/20/2019   Lab Results  Component Value Date   LDLCALC 134 (H) 12/20/2019   Lab Results  Component Value Date   TRIG 72 12/20/2019   Lab Results  Component Value Date   CHOLHDL 3.1 12/20/2019   Lab Results  Component Value Date   HGBA1C 5.3 12/20/2019       Assessment & Plan:   Problem  List Items Addressed This Visit    Preventative health care - Primary    Patient encouraged to maintain heart healthy diet, regular exercise, adequate sleep. Consider daily probiotics. Take medications as prescribed. Labs ordered and reviewed today. MGM due in next 6 months. PAP w ith next physical colonoscopy done 8/26/202 repeat in 10 years  Relevant Orders   Hemoglobin A1c   CBC with Differential/Platelet   Comprehensive metabolic panel   Iron, TIBC and Ferritin Panel   Lipid panel   TSH   Hyperlipidemia, mixed    Encouraged heart healthy diet, increase exercise, avoid trans fats, consider a krill oil cap daily      Relevant Orders   CBC with Differential/Platelet   Comprehensive metabolic panel   Iron, TIBC and Ferritin Panel   Lipid panel   Anemia   Relevant Orders   CBC with Differential/Platelet   Comprehensive metabolic panel   Iron, TIBC and Ferritin Panel   Lipid panel   Perimenopause    Occasional hot flashes and trouble sleeping, length and heaviness of cycles is starting to fluctuate some but still monthly. advised in sleep hygiene and need for low carb high protein diet and exercise       Other Visit Diagnoses    Encounter for screening mammogram for malignant neoplasm of breast       Relevant Orders   MM 3D SCREEN BREAST BILATERAL      Susan Richard does not currently have medications on file.  No orders of the defined types were placed in this encounter.    Penni Homans, MD

## 2021-02-28 NOTE — Telephone Encounter (Signed)
Received paperwork and waiting for labs come back

## 2021-02-28 NOTE — Patient Instructions (Addendum)
Magnesium Glycinate 200-400 mg at bed with Melatonin 2-10 mg at bed   Preventive Care 67-53 Years Old, Female Preventive care refers to lifestyle choices and visits with your health care provider that can promote health and wellness. This includes:  A yearly physical exam. This is also called an annual wellness visit.  Regular dental and eye exams.  Immunizations.  Screening for certain conditions.  Healthy lifestyle choices, such as: ? Eating a healthy diet. ? Getting regular exercise. ? Not using drugs or products that contain nicotine and tobacco. ? Limiting alcohol use. What can I expect for my preventive care visit? Physical exam Your health care provider will check your:  Height and weight. These may be used to calculate your BMI (body mass index). BMI is a measurement that tells if you are at a healthy weight.  Heart rate and blood pressure.  Body temperature.  Skin for abnormal spots. Counseling Your health care provider may ask you questions about your:  Past medical problems.  Family's medical history.  Alcohol, tobacco, and drug use.  Emotional well-being.  Home life and relationship well-being.  Sexual activity.  Diet, exercise, and sleep habits.  Work and work Statistician.  Access to firearms.  Method of birth control.  Menstrual cycle.  Pregnancy history. What immunizations do I need? Vaccines are usually given at various ages, according to a schedule. Your health care provider will recommend vaccines for you based on your age, medical history, and lifestyle or other factors, such as travel or where you work.   What tests do I need? Blood tests  Lipid and cholesterol levels. These may be checked every 5 years, or more often if you are over 32 years old.  Hepatitis C test.  Hepatitis B test. Screening  Lung cancer screening. You may have this screening every year starting at age 16 if you have a 30-pack-year history of smoking and  currently smoke or have quit within the past 15 years.  Colorectal cancer screening. ? All adults should have this screening starting at age 36 and continuing until age 69. ? Your health care provider may recommend screening at age 47 if you are at increased risk. ? You will have tests every 1-10 years, depending on your results and the type of screening test.  Diabetes screening. ? This is done by checking your blood sugar (glucose) after you have not eaten for a while (fasting). ? You may have this done every 1-3 years.  Mammogram. ? This may be done every 1-2 years. ? Talk with your health care provider about when you should start having regular mammograms. This may depend on whether you have a family history of breast cancer.  BRCA-related cancer screening. This may be done if you have a family history of breast, ovarian, tubal, or peritoneal cancers.  Pelvic exam and Pap test. ? This may be done every 3 years starting at age 74. ? Starting at age 60, this may be done every 5 years if you have a Pap test in combination with an HPV test. Other tests  STD (sexually transmitted disease) testing, if you are at risk.  Bone density scan. This is done to screen for osteoporosis. You may have this scan if you are at high risk for osteoporosis. Talk with your health care provider about your test results, treatment options, and if necessary, the need for more tests. Follow these instructions at home: Eating and drinking  Eat a diet that includes fresh fruits and vegetables,  whole grains, lean protein, and low-fat dairy products.  Take vitamin and mineral supplements as recommended by your health care provider.  Do not drink alcohol if: ? Your health care provider tells you not to drink. ? You are pregnant, may be pregnant, or are planning to become pregnant.  If you drink alcohol: ? Limit how much you have to 0-1 drink a day. ? Be aware of how much alcohol is in your drink. In the  [place] one drink equals one 12 oz bottle of beer (355 mL), one 5 oz glass of wine (148 mL), or one 1 oz glass of hard liquor (44 mL).   Lifestyle  Take daily care of your teeth and gums. Brush your teeth every morning and night with fluoride toothpaste. Floss one time each day.  Stay active. Exercise for at least 30 minutes 5 or more days each week.  Do not use any products that contain nicotine or tobacco, such as cigarettes, e-cigarettes, and chewing tobacco. If you need help quitting, ask your health care provider.  Do not use drugs.  If you are sexually active, practice safe sex. Use a condom or other form of protection to prevent STIs (sexually transmitted infections).  If you do not wish to become pregnant, use a form of birth control. If you plan to become pregnant, see your health care provider for a prepregnancy visit.  If told by your health care provider, take low-dose aspirin daily starting at age 75.  Find healthy ways to cope with stress, such as: ? Meditation, yoga, or listening to music. ? Journaling. ? Talking to a trusted person. ? Spending time with friends and family. Safety  Always wear your seat belt while driving or riding in a vehicle.  Do not drive: ? If you have been drinking alcohol. Do not ride with someone who has been drinking. ? When you are tired or distracted. ? While texting.  Wear a helmet and other protective equipment during sports activities.  If you have firearms in your house, make sure you follow all gun safety procedures. What's next?  Visit your health care provider once a year for an annual wellness visit.  Ask your health care provider how often you should have your eyes and teeth checked.  Stay up to date on all vaccines. This information is not intended to replace advice given to you by your health care provider. Make sure you discuss any questions you have with your health care provider. Document Revised: 07/24/2020 Document  Reviewed: 07/01/2018 Elsevier Patient Education  2021 [employer].

## 2021-03-01 LAB — IRON,TIBC AND FERRITIN PANEL
%SAT: 5 % (calc) — ABNORMAL LOW (ref 16–45)
Ferritin: 5 ng/mL — ABNORMAL LOW (ref 16–232)
Iron: 21 ug/dL — ABNORMAL LOW (ref 45–160)
TIBC: 401 mcg/dL (calc) (ref 250–450)

## 2021-03-04 ENCOUNTER — Other Ambulatory Visit: Payer: Self-pay

## 2021-03-04 DIAGNOSIS — D649 Anemia, unspecified: Secondary | ICD-10-CM

## 2021-03-04 MED ORDER — FERROUS FUMARATE 325 (106 FE) MG PO TABS: 1.0000 | ORAL_TABLET | Freq: Every day | ORAL | Status: DC

## 2021-03-04 MED ORDER — FERROUS FUMARATE 324 (106 FE) MG PO TABS: 1.0000 | ORAL_TABLET | Freq: Every day | ORAL | 3 refills | Status: DC

## 2021-03-04 NOTE — Telephone Encounter (Signed)
Paperwork faxed and copy sent to home address

## 2021-11-08 IMAGING — MG DIGITAL SCREENING BILAT W/ TOMO W/ CAD
3 series · 3 of 7 positions shown · non-contrast
Comparison: Previous exam(s).

CLINICAL DATA: Screening.

EXAM:
DIGITAL SCREENING BILATERAL MAMMOGRAM WITH TOMO AND CAD

[R MLO synth-2D]
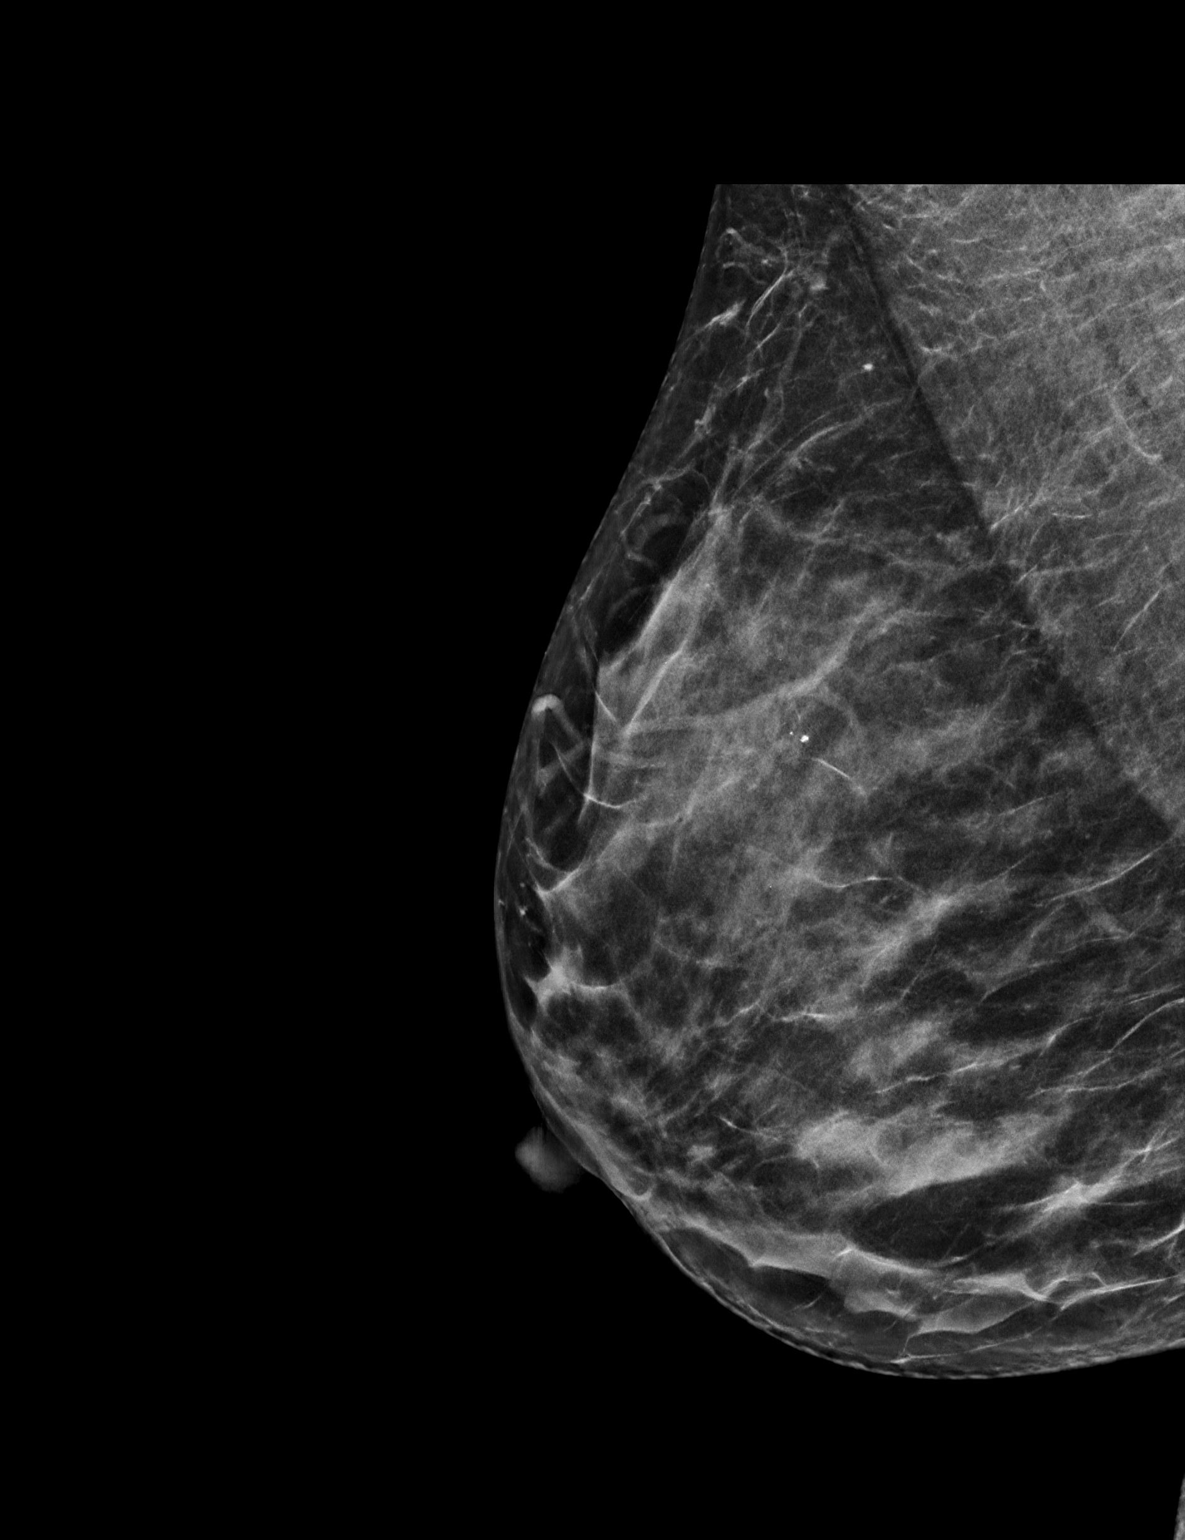

[L CC synth-2D]
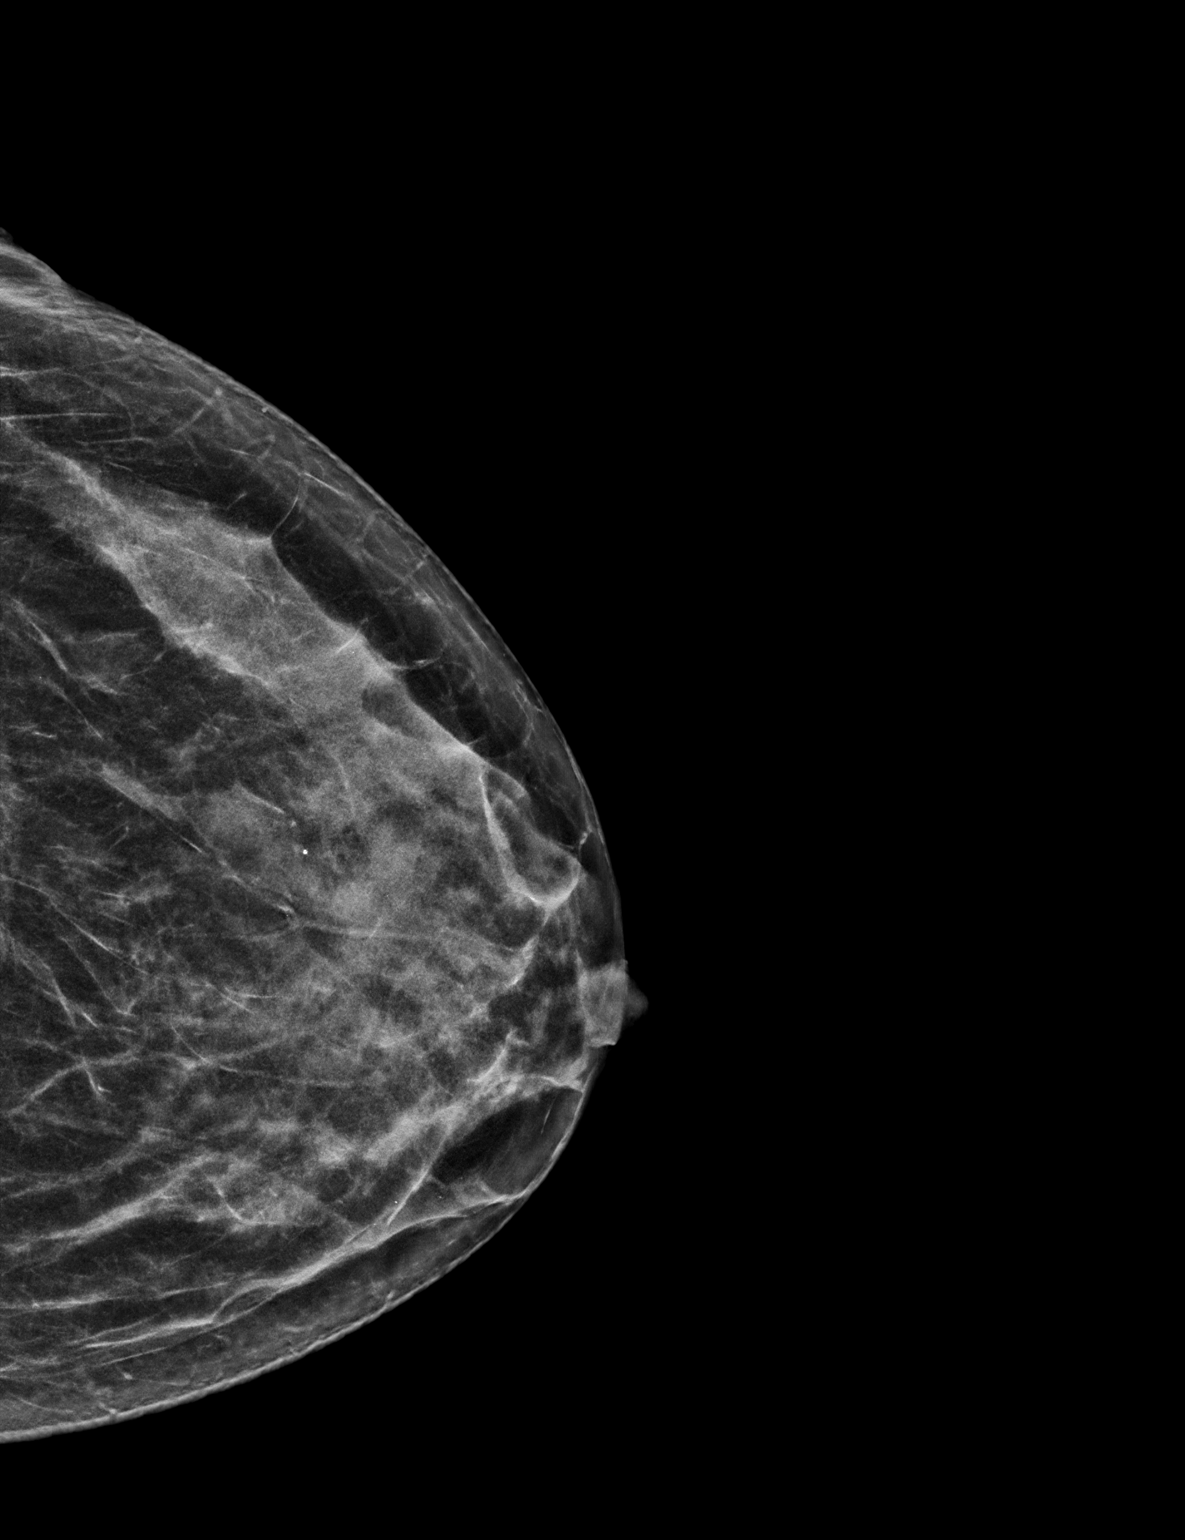

[L CC tomo · tomo slice 27/52.0]
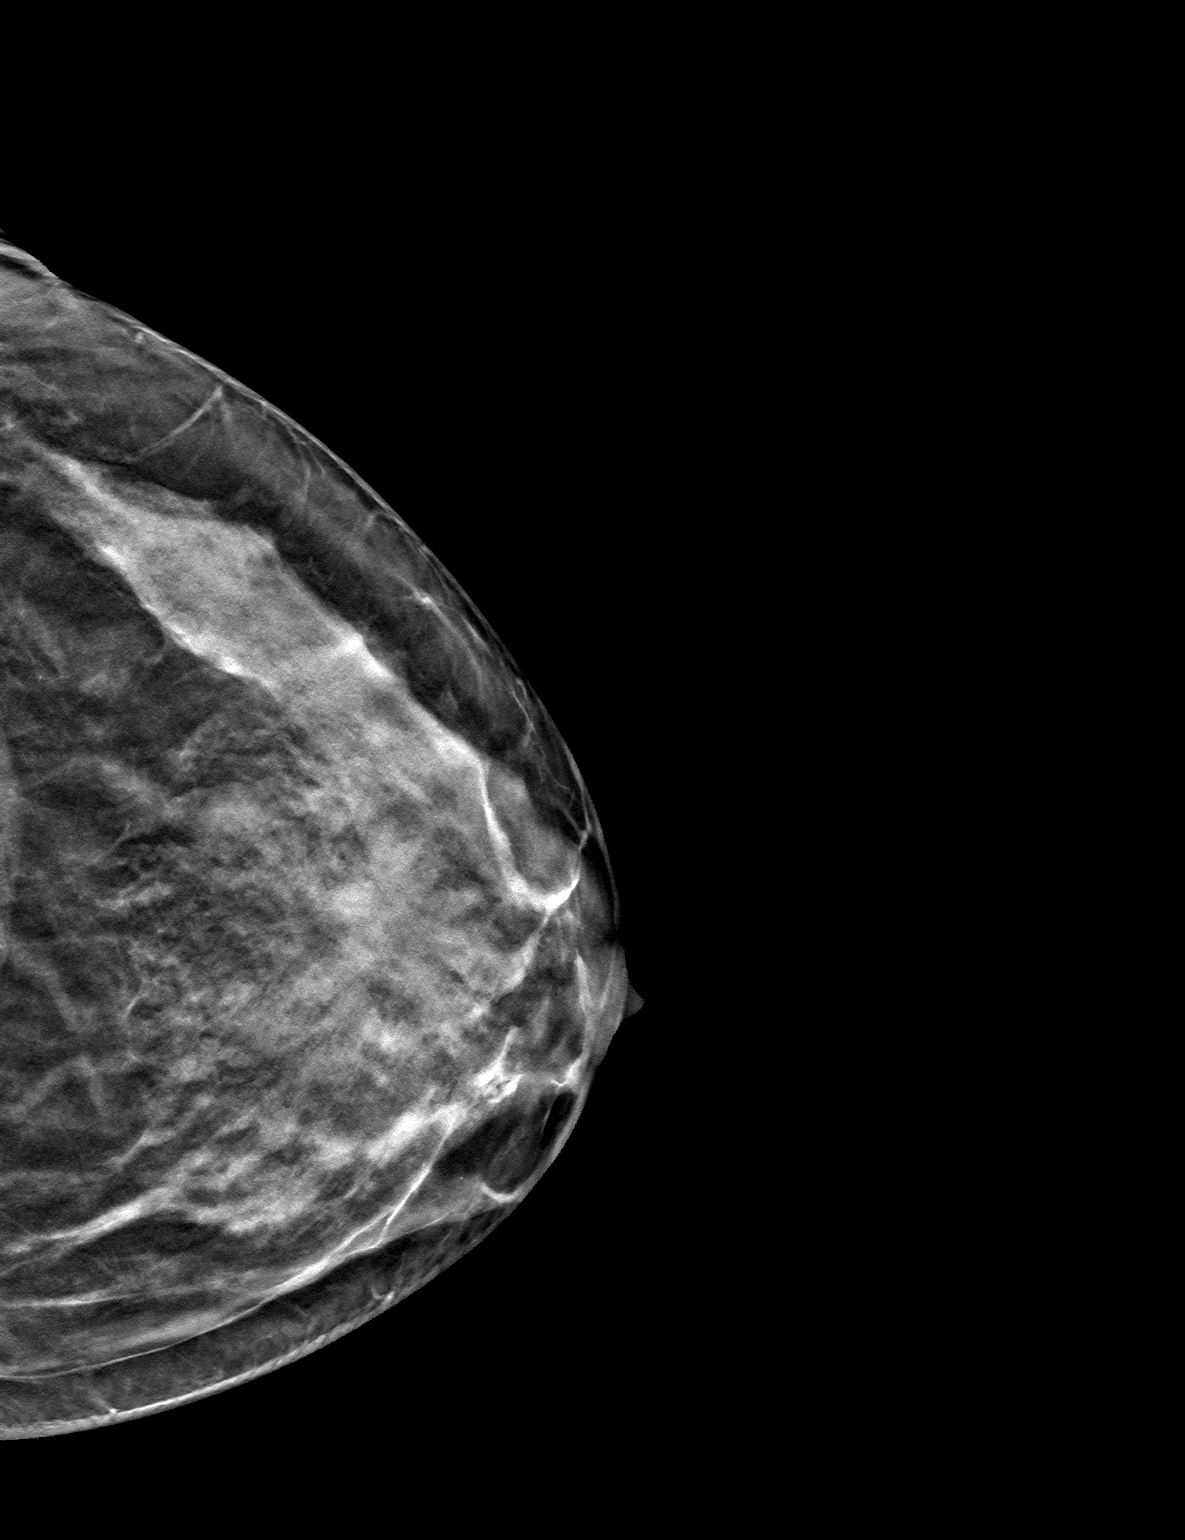

[3 of 7 positions shown; findings below may reference images not displayed]

ACR Breast Density Category d: The breast tissue is extremely dense,
which lowers the sensitivity of mammography
FINDINGS: There are no findings suspicious for malignancy. Images were
processed with CAD.
IMPRESSION: No mammographic evidence of malignancy. A result letter of this
screening mammogram will be mailed directly to the patient.

RECOMMENDATION:
Screening mammogram in one year. (Code:[id number])

BI-RADS CATEGORY  1: Negative.

## 2021-11-28 ENCOUNTER — Telehealth: Payer: Self-pay | Admitting: Family Medicine

## 2021-11-28 NOTE — Telephone Encounter (Signed)
Who Is Calling Patient / Member / Family / Caregiver Call Type Triage / Clinical Relationship To Patient Self Return Phone Number [phone] (Primary) Chief Complaint Headache Reason for Call Symptomatic / Request for Health Information Initial Comment Caller has Vertigo and asking can she be seen in the office today, Caller was having pressure on her left ear and feels warm and feels tight in her left temple/ she has a headache and she cant sleep because of the headache Translation No Nurse Assessment Nurse: Raenette Rover, RN, Zella Ball Date/Time (Eastern Time): 11/28/2021 9:48:39 AM Confirm and document reason for call. If symptomatic, describe symptoms. ---Caller has Vertigo started a couple weeks of go that is intermittent. When she turns to the left her head spins and when she turn her head back it goes back to normal. Ear feels warm and pressure to the left side. and asking can she be seen in the office today, She has a headache and she cant sleep because of the headache Does the patient have any new or worsening symptoms? ---Yes Will a triage be completed? ---Yes Related visit to physician within the last 2 weeks? ---No Does the PT have any chronic conditions? (i.e. diabetes, asthma, this includes High risk factors for pregnancy, etc.) ---Yes List chronic conditions. ---low blood pressure. Is the patient pregnant or possibly pregnant? (Ask all females between the ages of 56-55) ---No Is this a behavioral health or substance abuse call? ---No Guidelines Guideline Title Affirmed Question Affirmed Notes Nurse Date/Time Eilene Ghazi Time) Ear - Congestion Ear congestion present > 48 hours Raenette Rover, RN, Zella Ball 11/28/2021 9:52:23 AM  Disp. Time Eilene Ghazi Time) Disposition Final User 11/28/2021 9:54:44 AM SEE PCP WITHIN 3 DAYS Yes Raenette Rover, RN, Zella Ball

## 2021-11-28 NOTE — Telephone Encounter (Signed)
Patient scheduled with Dr. Larose Kells for tomorrow.

## 2021-11-28 NOTE — Telephone Encounter (Signed)
Pt stated she has been feeling dizzy for a couple weeks. Also stated her left ear feel numb/ warm. She only wanted to be seen by an MD/DO. Triaged her and also sched her tomorrow with Dr. Larose Kells.

## 2021-11-29 ENCOUNTER — Ambulatory Visit: Payer: 59 | Admitting: Internal Medicine

## 2022-07-08 NOTE — Progress Notes (Signed)
Subjective:   By signing my name below, I, Kellie Simmering, attest that this documentation has been prepared under the direction and in the presence of Mosie Lukes, MD 07/10/2022.     Patient ID: Susan Richard, female    DOB: 11-21-1965, 56 y.o.   MRN: [id number]  No chief complaint on file.  HPI Patient is in today for a comprehensive physical exam.   She denies having any fever, chills, ear pain, headaches, muscle pain, joint pain, new moles, rash, itching, congestion, sinus pain, sore throat, chest pain, palpitations, wheezing, nausea, vomitting, abdominal pain, diarrhea, constipation, blood in stool, dysuria, urgency, frequency and hematuria.  Family history: She reports no recent changes to her family history.   Diet: She maintains a well-balanced diet and hydrates appropriately.   Exercise: She walks 3 miles daily.   Dental: She is up to date on dental care.  Ear ringing: She complains that her left ear has been ringing for the past 3 months, which has been causing a high-pitched noise. Her right ear has been ringing for the past 6 weeks, which has been causing a lower-pitched noise. She has seen an ENT specialist, Dr. Laurance Flatten, about this.   Vertigo: She reports that she experienced vertigo, which lasted for one month, while traveling to [place] in 10/2021. She states that once the vertigo had subsides, her ears started ringing.   Immunizations: She has been informed about receiving COVID-19 and Flu immunizations. She is up to date on Tetanus immunizations.   Headaches: She reports having occasional headaches and pressure in her head. She states that she has headaches when she does not get enough sleep.   Sleep: She has difficulty falling and staying asleep.   Past Medical History:  Diagnosis Date   Cervical cancer screening 03/31/2012   Chicken pox as a child   Hyperlipidemia, mixed 05/07/2015   Preventative health care 03/31/2012   Past Surgical History:  Procedure  Laterality Date   [place] and 1997   X 2   COLONOSCOPY  06/28/2020   Family History  Problem Relation Age of Onset   Colon cancer Neg Hx    Esophageal cancer Neg Hx    Rectal cancer Neg Hx    Stomach cancer Neg Hx    Social History   Socioeconomic History   Marital status: Married    Spouse name: Not on file   Number of children: Not on file   Years of education: Not on file   Highest education level: Not on file  Occupational History   Occupation: Hair stylist  Tobacco Use   Smoking status: Never   Smokeless tobacco: Never  Substance and Sexual Activity   Alcohol use: No   Drug use: No   Sexual activity: Not Currently  Other Topics Concern   Not on file  Social History Narrative   Not on file   Social Determinants of Health   Financial Resource Strain: Not on file  Food Insecurity: Not on file  Transportation Needs: Not on file  Physical Activity: Sufficiently Active (09/06/2018)   Exercise Vital Sign    Days of Exercise per Week: 4 days    Minutes of Exercise per Session: 40 min  Stress: No Stress Concern Present (09/06/2018)   [employer]    Feeling of Stress : Only a little  Social Connections: Moderately Integrated (09/06/2018)   Social Connection and Isolation Panel [NHANES]    Frequency  of Communication with Friends and Family: More than three times a week    Frequency of Social Gatherings with Friends and Family: More than three times a week    Attends Religious Services: 1 to 4 times per year    Active Member of [employer] or Organizations: No    Attends Archivist Meetings: Never    Marital Status: Married  Human resources officer Violence: Not At Risk (09/06/2018)   Humiliation, Afraid, Rape, and Kick questionnaire    Fear of Current or Ex-Partner: No    Emotionally Abused: No    Physically Abused: No    Sexually Abused: No   Outpatient Medications Prior to Visit   Medication Sig Dispense Refill   Ferrous Fumarate (HEMOCYTE) 324 (106 Fe) MG TABS tablet Take 1 tablet (106 mg of iron total) by mouth daily. 30 tablet 3   No facility-administered medications prior to visit.   No Known Allergies  Review of Systems  Constitutional:  Negative for chills and fever.  HENT:  Negative for congestion, ear pain, sinus pain and sore throat.   Respiratory:  Negative for cough, shortness of breath and wheezing.   Cardiovascular:  Negative for chest pain and palpitations.  Gastrointestinal:  Negative for abdominal pain, blood in stool, constipation, diarrhea, nausea and vomiting.  Genitourinary:  Negative for dysuria, frequency, hematuria and urgency.  Musculoskeletal:  Negative for joint pain and myalgias.  Skin:  Negative for itching and rash.       (-) New moles.  Neurological:  Negative for headaches.      Objective:    Physical Exam Constitutional:      General: She is not in acute distress.    Appearance: Normal appearance. She is not ill-appearing.  HENT:     Head: Normocephalic and atraumatic.     Right Ear: Tympanic membrane, ear canal and external ear normal.     Left Ear: Tympanic membrane, ear canal and external ear normal.     Mouth/Throat:     Mouth: Mucous membranes are moist.     Pharynx: Oropharynx is clear.  Eyes:     Extraocular Movements: Extraocular movements intact.     Right eye: No nystagmus.     Left eye: No nystagmus.     Pupils: Pupils are equal, round, and reactive to light.  Neck:     Vascular: No carotid bruit.  Cardiovascular:     Rate and Rhythm: Normal rate and regular rhythm.     Pulses: Normal pulses.     Heart sounds: Normal heart sounds. No murmur heard.    No gallop.  Pulmonary:     Effort: Pulmonary effort is normal. No respiratory distress.     Breath sounds: Normal breath sounds. No wheezing or rales.  Abdominal:     General: Bowel sounds are normal.     Palpations: Abdomen is soft.     Tenderness:  There is no abdominal tenderness.  Genitourinary:    General: Normal vulva.     Vagina: No vaginal discharge.  Musculoskeletal:     Comments: Muscle strength 5/5 on upper and lower extremities.   Lymphadenopathy:     Cervical: No cervical adenopathy.  Skin:    General: Skin is warm and dry.  Neurological:     Mental Status: She is alert and oriented to person, place, and time.     Deep Tendon Reflexes:     Reflex Scores:      Patellar reflexes are 2+ on the right side  and 2+ on the left side. Psychiatric:        Mood and Affect: Mood normal.        Behavior: Behavior normal.        Judgment: Judgment normal.    There were no vitals taken for this visit. Wt Readings from Last 3 Encounters:  02/28/21 109 lb 14.4 oz (49.9 kg)  06/28/20 116 lb (52.6 kg)  06/14/20 116 lb (52.6 kg)   Diabetic Foot Exam - Simple   No data filed    Lab Results  Component Value Date   WBC 4.0 02/28/2021   HGB 10.8 (L) 02/28/2021   HCT 34.5 (L) 02/28/2021   PLT 222.0 02/28/2021   GLUCOSE 88 02/28/2021   CHOL 199 02/28/2021   TRIG 54.0 02/28/2021   HDL 70.60 02/28/2021   LDLCALC 118 (H) 02/28/2021   ALT 32 02/28/2021   AST 33 02/28/2021   NA 138 02/28/2021   K 4.4 02/28/2021   CL 104 02/28/2021   CREATININE 0.70 02/28/2021   BUN 17 02/28/2021   CO2 29 02/28/2021   TSH 2.82 02/28/2021   HGBA1C 5.7 02/28/2021   Lab Results  Component Value Date   TSH 2.82 02/28/2021   Lab Results  Component Value Date   WBC 4.0 02/28/2021   HGB 10.8 (L) 02/28/2021   HCT 34.5 (L) 02/28/2021   MCV 71.3 (L) 02/28/2021   PLT 222.0 02/28/2021   Lab Results  Component Value Date   NA 138 02/28/2021   K 4.4 02/28/2021   CO2 29 02/28/2021   GLUCOSE 88 02/28/2021   BUN 17 02/28/2021   CREATININE 0.70 02/28/2021   BILITOT 0.3 02/28/2021   ALKPHOS 45 02/28/2021   AST 33 02/28/2021   ALT 32 02/28/2021   PROT 7.2 02/28/2021   ALBUMIN 4.3 02/28/2021   CALCIUM 9.2 02/28/2021   GFR 99.33 02/28/2021    Lab Results  Component Value Date   CHOL 199 02/28/2021   Lab Results  Component Value Date   HDL 70.60 02/28/2021   Lab Results  Component Value Date   LDLCALC 118 (H) 02/28/2021   Lab Results  Component Value Date   TRIG 54.0 02/28/2021   Lab Results  Component Value Date   CHOLHDL 3 02/28/2021   Lab Results  Component Value Date   HGBA1C 5.7 02/28/2021   Colonoscopy: Last completed on 06/28/2020. Repeat in 7 years.  - One 1 mm polyp in the sigmoid colon, removed with a cold snare. Resected and retrieved. - Non-bleeding internal hemorrhoids. - The examination was otherwise normal on direct and retroflexion views.  Pap Smear: Last completed on 09/06/2018. Normal results. Repeat in 3-5 years.   Mammogram: Last completed on 01/31/2020. Repeat in 1 year. No mammographic evidence of malignancy.    Assessment & Plan:   Problem List Items Addressed This Visit   None  No orders of the defined types were placed in this encounter.  I, Kellie Simmering, personally preformed the services described in this documentation.  All medical record entries made by the scribe were at my direction and in my presence.  I have reviewed the chart and discharge instructions (if applicable) and agree that the record reflects my personal performance and is accurate and complete. 07/10/2022  I,Mohammed Iqbal,acting as a scribe for Penni Homans, MD.,have documented all relevant documentation on the behalf of Penni Homans, MD,as directed by  Penni Homans, MD while in the presence of Penni Homans, MD.  Kellie Simmering

## 2022-07-09 DIAGNOSIS — Z86010 Personal history of colonic polyps: Secondary | ICD-10-CM | POA: Insufficient documentation

## 2022-07-09 NOTE — Assessment & Plan Note (Signed)
Increase leafy greens, consider increased lean red meat and using cast iron cookware. Continue to monitor, report any concerns 

## 2022-07-09 NOTE — Assessment & Plan Note (Signed)
Encourage heart healthy diet such as MIND or DASH diet, increase exercise, avoid trans fats, simple carbohydrates and processed foods, consider a krill or fish or flaxseed oil cap daily.  °

## 2022-07-09 NOTE — Assessment & Plan Note (Addendum)
Patient encouraged to maintain heart healthy diet, regular exercise, adequate sleep. Consider daily probiotics. Take medications as prescribed. Labs ordered and reviewed.  Covid booster when new version out late September At pharmacy High dose flu shot mid Sept to mid Oct Colonoscopy August 2021, repeat in 2028. Pap 2019 repeat today, MGM 2021, ordered today

## 2022-07-10 ENCOUNTER — Other Ambulatory Visit (HOSPITAL_COMMUNITY)
Admission: RE | Admit: 2022-07-10 | Discharge: 2022-07-10 | Disposition: A | Discharge status: Home / Self Care | Payer: 59 | Source: Ambulatory Visit | Attending: Family Medicine | Admitting: Family Medicine

## 2022-07-10 ENCOUNTER — Ambulatory Visit (INDEPENDENT_AMBULATORY_CARE_PROVIDER_SITE_OTHER): Payer: 59 | Admitting: Family Medicine

## 2022-07-10 VITALS — BP 100/55 | HR 78 | Temp 98.0°F | Resp 16 | Ht 60.0 in | Wt 116.2 lb

## 2022-07-10 DIAGNOSIS — R519 Headache, unspecified: Secondary | ICD-10-CM | POA: Diagnosis not present

## 2022-07-10 DIAGNOSIS — Z124 Encounter for screening for malignant neoplasm of cervix: Secondary | ICD-10-CM

## 2022-07-10 DIAGNOSIS — H6983 Other specified disorders of Eustachian tube, bilateral: Secondary | ICD-10-CM

## 2022-07-10 DIAGNOSIS — D649 Anemia, unspecified: Secondary | ICD-10-CM | POA: Diagnosis not present

## 2022-07-10 DIAGNOSIS — Z86010 Personal history of colonic polyps: Secondary | ICD-10-CM | POA: Diagnosis not present

## 2022-07-10 DIAGNOSIS — E785 Hyperlipidemia, unspecified: Secondary | ICD-10-CM

## 2022-07-10 DIAGNOSIS — Z0000 Encounter for general adult medical examination without abnormal findings: Secondary | ICD-10-CM

## 2022-07-10 DIAGNOSIS — H9313 Tinnitus, bilateral: Secondary | ICD-10-CM

## 2022-07-10 DIAGNOSIS — Z1231 Encounter for screening mammogram for malignant neoplasm of breast: Secondary | ICD-10-CM

## 2022-07-10 DIAGNOSIS — H9319 Tinnitus, unspecified ear: Secondary | ICD-10-CM | POA: Insufficient documentation

## 2022-07-10 LAB — COMPREHENSIVE METABOLIC PANEL
ALT: 18 U/L (ref 0–35)
AST: 23 U/L (ref 0–37)
Albumin: 4.1 g/dL (ref 3.5–5.2)
Alkaline Phosphatase: 48 U/L (ref 39–117)
BUN: 13 mg/dL (ref 6–23)
CO2: 30 mEq/L (ref 19–32)
Calcium: 9.3 mg/dL (ref 8.4–10.5)
Chloride: 103 mEq/L (ref 96–112)
Creatinine, Ser: 0.77 mg/dL (ref 0.40–1.20)
GFR: 87.75 mL/min (ref >60.00)
Glucose, Bld: 88 mg/dL (ref 70–99)
Potassium: 3.9 mEq/L (ref 3.5–5.1)
Sodium: 139 mEq/L (ref 135–145)
Total Bilirubin: 0.5 mg/dL (ref 0.2–1.2)
Total Protein: 7.2 g/dL (ref 6.0–8.3)

## 2022-07-10 LAB — CBC
HCT: 41.2 % (ref 36.0–46.0)
Hemoglobin: 13.7 g/dL (ref 12.0–15.0)
MCHC: 33.3 g/dL (ref 30.0–36.0)
MCV: 89.7 fl (ref 78.0–100.0)
Platelets: 173 10*3/uL (ref 150.0–400.0)
RBC: 4.59 Mil/uL (ref 3.87–5.11)
RDW: 13.4 % (ref 11.5–15.5)
WBC: 4.4 10*3/uL (ref 4.0–10.5)

## 2022-07-10 LAB — LIPID PANEL
Cholesterol: 223 mg/dL — ABNORMAL HIGH (ref 0–200)
HDL: 65.9 mg/dL (ref >39.00)
LDL Cholesterol: 139 mg/dL — ABNORMAL HIGH (ref 0–99)
NonHDL: 157.55
Total CHOL/HDL Ratio: 3
Triglycerides: 95 mg/dL (ref 0.0–149.0)
VLDL: 19 mg/dL (ref 0.0–40.0)

## 2022-07-10 LAB — TSH: TSH: 1.65 u[IU]/mL (ref 0.35–5.50)

## 2022-07-10 LAB — SEDIMENTATION RATE: Sed Rate: 22 mm/hr (ref 0–30)

## 2022-07-10 NOTE — Assessment & Plan Note (Signed)
Bilateral the left ear started about 3 months ago with a high pitched buzz then the right ear started about 6 weeks ago and is more low pitched buzz. She saw an ENT and they offered no solutions. Start a multivitamin with minerals, fatty acid supplement

## 2022-07-10 NOTE — Patient Instructions (Addendum)
Susan Richard makes vitamins  Magnesium Glycinate 200-400 mg at bed for sleep L Tyrptophan capsules for sleep Melatonin 2-4 mg for sleep  60-80 ounces of fluids daily  Steps 4000-8000 a day  NOW sleep + capsules for sleep   Encouraged good sleep hygiene such as dark, quiet room. No blue/green glowing lights such as computer screens in bedroom. No alcohol or stimulants in evening. Cut down on caffeine as able. Regular exercise is helpful but not just prior to bed time.     Multivitamin with minerals Fatty acid such as fish or krill or flaxseed oil daily  Covid booster when new version out late September At pharmacy High dose flu shot mid Sept to mid Oct  Tetanus if bad injury only, otherwise due in September 2028 Tinnitus Tinnitus refers to hearing a sound when there is no actual source for that sound. This is often described as ringing in the ears. However, people with this condition may hear a variety of noises, in one ear or in both ears. The sounds of tinnitus can be soft, loud, or somewhere in between. Tinnitus can last for a few seconds or can be constant for days. It may go away without treatment and come back at various times. When tinnitus is constant or happens often, it can lead to other problems, such as trouble sleeping and trouble concentrating. Almost everyone experiences tinnitus at some point. Tinnitus is not the same as hearing loss. Tinnitus that is long-lasting (chronic) or comes back often (recurs) may require medical attention. What are the causes? The cause of tinnitus is often not known. In some cases, it can result from: Exposure to loud noises from machinery, music, or other sources. An object (foreign body) stuck in the ear. Earwax buildup. Drinking alcohol or caffeine. Taking certain medicines. Age-related hearing loss. It may also be caused by medical conditions such as: Ear or sinus infections. Heart diseases or high blood  pressure. Allergies. Mnire's disease. Thyroid problems. Tumors. A weak, bulging blood vessel (aneurysm) near the ear. What increases the risk? The following factors may make you more likely to develop this condition: Exposure to loud noises. Age. Tinnitus is more likely in older individuals. Using alcohol or tobacco. What are the signs or symptoms? The main symptom of tinnitus is hearing a sound when there is no source for that sound. It may sound like: Buzzing. Sizzling. Ringing. Blowing air. Hissing. Whistling. Other sounds may include: Roaring. Running water. A musical note. Tapping. Humming. Symptoms may affect only one ear (unilateral) or both ears (bilateral). How is this diagnosed? Tinnitus is diagnosed based on your symptoms, your medical history, and a physical exam. Your health care provider may do a thorough hearing test (audiologic exam) if your tinnitus: Is unilateral. Causes hearing difficulties. Lasts 6 months or longer. You may work with a health care provider who specializes in hearing disorders (audiologist). You may be asked questions about your symptoms and how they affect your daily life. You may have other tests done, such as: CT scan. MRI. An imaging test of how blood flows through your blood vessels (angiogram). How is this treated? Treating an underlying medical condition can sometimes make tinnitus go away. If your tinnitus continues, other treatments may include: Therapy and counseling to help you manage the stress of living with tinnitus. Sound generators to mask the tinnitus. These include: Tabletop sound machines that play relaxing sounds to help you fall asleep. Wearable devices that fit in your ear and play sounds or music.  Acoustic neural stimulation. This involves using headphones to listen to music that contains an auditory signal. Over time, listening to this signal may change some pathways in your brain and make you less sensitive to  tinnitus. This treatment is used for very severe cases when no other treatment is working. Using hearing aids or cochlear implants if your tinnitus is related to hearing loss. Hearing aids are worn in the outer ear. Cochlear implants are surgically placed in the inner ear. Follow these instructions at home: Managing symptoms     When possible, avoid being in loud places and being exposed to loud sounds. Wear hearing protection, such as earplugs, when you are exposed to loud noises. Use a white noise machine, a humidifier, or other devices to mask the sound of tinnitus. Practice techniques for reducing stress, such as meditation, yoga, or deep breathing. Work with your health care provider if you need help with managing stress. Sleep with your head slightly raised. This may reduce the impact of tinnitus. General instructions Do not use stimulants, such as nicotine, alcohol, or caffeine. Talk with your health care provider about other stimulants to avoid. Stimulants are substances that can make you feel alert and attentive by increasing certain activities in the body (such as heart rate and blood pressure). These substances may make tinnitus worse. Take over-the-counter and prescription medicines only as told by your health care provider. Try to get plenty of sleep each night. Keep all follow-up visits. This is important. Contact a health care provider if: Your tinnitus continues for 3 weeks or longer without stopping. You develop sudden hearing loss. Your symptoms get worse or do not get better with home care. You feel you are not able to manage the stress of living with tinnitus. Get help right away if: You develop tinnitus after a head injury. You have tinnitus along with any of the following: Dizziness. Nausea and vomiting. Loss of balance. Sudden, severe headache. Vision changes. Facial weakness or weakness of arms or legs. These symptoms may represent a serious problem that is an  emergency. Do not wait to see if the symptoms will go away. Get medical help right away. Call your local emergency services (911 in the [place] Do not drive yourself to the hospital. Summary Tinnitus refers to hearing a sound when there is no actual source for that sound. This is often described as ringing in the ears. Symptoms may affect only one ear (unilateral) or both ears (bilateral). Use a white noise machine, a humidifier, or other devices to mask the sound of tinnitus. Do not use stimulants, such as nicotine, alcohol, or caffeine. These substances may make tinnitus worse. This information is not intended to replace advice given to you by your health care provider. Make sure you discuss any questions you have with your health care provider. Document Revised: 09/24/2020 Document Reviewed: 09/24/2020 Elsevier Patient Education  2023 Elsevier Inc.  Preventive Care 58-34 Years Old, Female Preventive care refers to lifestyle choices and visits with your health care provider that can promote health and wellness. Preventive care visits are also called wellness exams. What can I expect for my preventive care visit? Counseling Your health care provider may ask you questions about your: Medical history, including: Past medical problems. Family medical history. Pregnancy history. Current health, including: Menstrual cycle. Method of birth control. Emotional well-being. Home life and relationship well-being. Sexual activity and sexual health. Lifestyle, including: Alcohol, nicotine or tobacco, and drug use. Access to firearms. Diet, exercise, and sleep habits.  Work and work Statistician. Sunscreen use. Safety issues such as seatbelt and bike helmet use. Physical exam Your health care provider will check your: Height and weight. These may be used to calculate your BMI (body mass index). BMI is a measurement that tells if you are at a healthy weight. Waist circumference. This measures the  distance around your waistline. This measurement also tells if you are at a healthy weight and may help predict your risk of certain diseases, such as type 2 diabetes and high blood pressure. Heart rate and blood pressure. Body temperature. Skin for abnormal spots. What immunizations do I need?  Vaccines are usually given at various ages, according to a schedule. Your health care provider will recommend vaccines for you based on your age, medical history, and lifestyle or other factors, such as travel or where you work. What tests do I need? Screening Your health care provider may recommend screening tests for certain conditions. This may include: Lipid and cholesterol levels. Diabetes screening. This is done by checking your blood sugar (glucose) after you have not eaten for a while (fasting). Pelvic exam and Pap test. Hepatitis B test. Hepatitis C test. HIV (human immunodeficiency virus) test. STI (sexually transmitted infection) testing, if you are at risk. Lung cancer screening. Colorectal cancer screening. Mammogram. Talk with your health care provider about when you should start having regular mammograms. This may depend on whether you have a family history of breast cancer. BRCA-related cancer screening. This may be done if you have a family history of breast, ovarian, tubal, or peritoneal cancers. Bone density scan. This is done to screen for osteoporosis. Talk with your health care provider about your test results, treatment options, and if necessary, the need for more tests. Follow these instructions at home: Eating and drinking  Eat a diet that includes fresh fruits and vegetables, whole grains, lean protein, and low-fat dairy products. Take vitamin and mineral supplements as recommended by your health care provider. Do not drink alcohol if: Your health care provider tells you not to drink. You are pregnant, may be pregnant, or are planning to become pregnant. If you drink  alcohol: Limit how much you have to 0-1 drink a day. Know how much alcohol is in your drink. In the [place] one drink equals one 12 oz bottle of beer (355 mL), one 5 oz glass of wine (148 mL), or one 1 oz glass of hard liquor (44 mL). Lifestyle Brush your teeth every morning and night with fluoride toothpaste. Floss one time each day. Exercise for at least 30 minutes 5 or more days each week. Do not use any products that contain nicotine or tobacco. These products include cigarettes, chewing tobacco, and vaping devices, such as e-cigarettes. If you need help quitting, ask your health care provider. Do not use drugs. If you are sexually active, practice safe sex. Use a condom or other form of protection to prevent STIs. If you do not wish to become pregnant, use a form of birth control. If you plan to become pregnant, see your health care provider for a prepregnancy visit. Take aspirin only as told by your health care provider. Make sure that you understand how much to take and what form to take. Work with your health care provider to find out whether it is safe and beneficial for you to take aspirin daily. Find healthy ways to manage stress, such as: Meditation, yoga, or listening to music. Journaling. Talking to a trusted person. Spending time with friends and  family. Minimize exposure to UV radiation to reduce your risk of skin cancer. Safety Always wear your seat belt while driving or riding in a vehicle. Do not drive: If you have been drinking alcohol. Do not ride with someone who has been drinking. When you are tired or distracted. While texting. If you have been using any mind-altering substances or drugs. Wear a helmet and other protective equipment during sports activities. If you have firearms in your house, make sure you follow all gun safety procedures. Seek help if you have been physically or sexually abused. What's next? Visit your health care provider once a year for an  annual wellness visit. Ask your health care provider how often you should have your eyes and teeth checked. Stay up to date on all vaccines. This information is not intended to replace advice given to you by your health care provider. Make sure you discuss any questions you have with your health care provider. Document Revised: 04/17/2021 Document Reviewed: 04/17/2021 Elsevier Patient Education  [employer].

## 2022-07-13 DIAGNOSIS — H6993 Unspecified Eustachian tube disorder, bilateral: Secondary | ICD-10-CM | POA: Insufficient documentation

## 2022-07-13 DIAGNOSIS — H6983 Other specified disorders of Eustachian tube, bilateral: Secondary | ICD-10-CM | POA: Insufficient documentation

## 2022-07-13 NOTE — Assessment & Plan Note (Signed)
Her left ear started first several months ago then her right ear also started to roar about 6 weeks ago. She has seen Dr Laurance Flatten of ENT but no concerns were identified. She is encouraged to use Flonase and nasal saline and to perform a gentle Valsalva Maneuver after each dose.

## 2022-07-13 NOTE — Assessment & Plan Note (Signed)
Pap today, no concerns on exam.  

## 2022-07-14 LAB — CYTOLOGY - PAP
Adequacy: ABSENT
Diagnosis: NEGATIVE

## 2022-08-26 ENCOUNTER — Ambulatory Visit
Admission: RE | Admit: 2022-08-26 | Discharge: 2022-08-26 | Disposition: A | Discharge status: Home / Self Care | Payer: 59 | Source: Ambulatory Visit | Attending: Family Medicine | Admitting: Family Medicine

## 2022-08-26 DIAGNOSIS — Z1231 Encounter for screening mammogram for malignant neoplasm of breast: Secondary | ICD-10-CM

## 2023-08-18 ENCOUNTER — Encounter: Payer: 59 | Admitting: Family Medicine

## 2023-09-14 ENCOUNTER — Ambulatory Visit: Payer: 59 | Admitting: Family Medicine

## 2023-09-14 ENCOUNTER — Other Ambulatory Visit (HOSPITAL_COMMUNITY)
Admission: RE | Admit: 2023-09-14 | Discharge: 2023-09-14 | Disposition: A | Discharge status: Home / Self Care | Payer: 59 | Source: Ambulatory Visit | Attending: Family Medicine | Admitting: Family Medicine

## 2023-09-14 ENCOUNTER — Telehealth: Payer: Self-pay | Admitting: Family Medicine

## 2023-09-14 ENCOUNTER — Encounter: Payer: Self-pay | Admitting: Family Medicine

## 2023-09-14 VITALS — BP 110/60 | HR 62 | Temp 97.6°F | Resp 18 | Ht 60.0 in | Wt 114.8 lb

## 2023-09-14 DIAGNOSIS — Z13220 Encounter for screening for lipoid disorders: Secondary | ICD-10-CM

## 2023-09-14 DIAGNOSIS — N950 Postmenopausal bleeding: Secondary | ICD-10-CM | POA: Insufficient documentation

## 2023-09-14 DIAGNOSIS — Z131 Encounter for screening for diabetes mellitus: Secondary | ICD-10-CM

## 2023-09-14 LAB — LIPID PANEL
Cholesterol: 263 mg/dL — ABNORMAL HIGH (ref 0–200)
HDL: 73.8 mg/dL (ref >39.00)
LDL Cholesterol: 170 mg/dL — ABNORMAL HIGH (ref 0–99)
NonHDL: 189.32
Total CHOL/HDL Ratio: 4
Triglycerides: 99 mg/dL (ref 0.0–149.0)
VLDL: 19.8 mg/dL (ref 0.0–40.0)

## 2023-09-14 LAB — POCT URINALYSIS DIP (MANUAL ENTRY)
Bilirubin, UA: NEGATIVE
Glucose, UA: NEGATIVE mg/dL
Ketones, POC UA: NEGATIVE mg/dL
Leukocytes, UA: NEGATIVE
Nitrite, UA: NEGATIVE
Protein Ur, POC: NEGATIVE mg/dL
Spec Grav, UA: <=1.005 — AB (ref 1.010–1.025)
Urobilinogen, UA: 0.2 U/dL
pH, UA: 6 (ref 5.0–8.0)

## 2023-09-14 LAB — HEMOGLOBIN A1C: Hgb A1c MFr Bld: 5.4 % (ref 4.6–6.5)

## 2023-09-14 LAB — URINALYSIS, MICROSCOPIC ONLY

## 2023-09-14 LAB — COMPREHENSIVE METABOLIC PANEL
ALT: 21 U/L (ref 0–35)
AST: 27 U/L (ref 0–37)
Albumin: 4.6 g/dL (ref 3.5–5.2)
Alkaline Phosphatase: 57 U/L (ref 39–117)
BUN: 16 mg/dL (ref 6–23)
CO2: 28 meq/L (ref 19–32)
Calcium: 9.6 mg/dL (ref 8.4–10.5)
Chloride: 106 meq/L (ref 96–112)
Creatinine, Ser: 0.73 mg/dL (ref 0.40–1.20)
GFR: 92.78 mL/min (ref >60.00)
Glucose, Bld: 90 mg/dL (ref 70–99)
Potassium: 3.9 meq/L (ref 3.5–5.1)
Sodium: 142 meq/L (ref 135–145)
Total Bilirubin: 0.4 mg/dL (ref 0.2–1.2)
Total Protein: 7.7 g/dL (ref 6.0–8.3)

## 2023-09-14 LAB — CBC
HCT: 43.3 % (ref 36.0–46.0)
Hemoglobin: 14.5 g/dL (ref 12.0–15.0)
MCHC: 33.4 g/dL (ref 30.0–36.0)
MCV: 91.6 fL (ref 78.0–100.0)
Platelets: 211 10*3/uL (ref 150.0–400.0)
RBC: 4.73 Mil/uL (ref 3.87–5.11)
RDW: 14.1 % (ref 11.5–15.5)
WBC: 5 10*3/uL (ref 4.0–10.5)

## 2023-09-14 LAB — TSH: TSH: 2.44 u[IU]/mL (ref 0.35–5.50)

## 2023-09-14 NOTE — Patient Instructions (Signed)
It was good to see you today, thank you for bringing this bleeding to our attention.  As we discussed, vaginal bleeding after menopause is not normal.  Often it does not mean anything bad, but occasionally it can be a warning sign of cancer in the uterus-endometrial cancer.  We need to rule this out!  We will get some blood work and your vaginal swab results back ASAP, and I am going to refer you to a gynecologist to look into this further.

## 2023-09-14 NOTE — Progress Notes (Addendum)
[facility] [address], [address] [place], [place] [address] [phone]  Date:  09/14/2023   Name:  Susan Richard   DOB:  1966-08-23   MRN:  [id number]  PCP:  Bradd Canary, MD    Chief Complaint: Vaginal Bleeding (Pt says her period stopped about 2 years ago. But over the weekend she noticed some spotting- pink in color, no pain. This morning she seems to be fine. )   History of Present Illness:  Susan Richard is a 56 y.o. very pleasant female patient who presents with the following:  Pt seen today with concern of vaginal spotting over this past weekend- her LMP was over 2 years ago  She noted a pink color on the tissue when she wiped-seemed vaginal in origin Did not seem to be rectal bleeding Has resolved today She has not seen any hematuria  She had 2 C-sections, otherwise no GYN surgery Negative pap last year   She otherwise feels fine, no symptoms or pain No particular vaginal burning or itching  Patient Active Problem List   Diagnosis Date Noted   Tinnitus 07/10/2022   Hx of colonic polyp 07/09/2022   Perimenopause 02/28/2021   Anemia 12/20/2019   Hair loss 09/06/2018   Hyperlipidemia 05/07/2015   Cervical cancer screening 03/31/2012   Preventative health care 03/31/2012    Past Medical History:  Diagnosis Date   Cervical cancer screening 03/31/2012   Chicken pox as a child   Hyperlipidemia, mixed 05/07/2015   Preventative health care 03/31/2012    Past Surgical History:  Procedure Laterality Date   CESAREAN SECTION  1995 and 1997   X 2   COLONOSCOPY  06/28/2020    Social History   Tobacco Use   Smoking status: Never   Smokeless tobacco: Never  Substance Use Topics   Alcohol use: No   Drug use: No    Family History  Problem Relation Age of Onset   Colon cancer Neg Hx    Esophageal cancer Neg Hx    Rectal cancer Neg Hx    Stomach cancer Neg Hx     No Known Allergies  Medication list  has been reviewed and updated.  No current outpatient medications on file prior to visit.   No current facility-administered medications on file prior to visit.    Review of Systems:  As per HPI- otherwise negative.   Physical Examination: Vitals:   09/14/23 0944  BP: 110/60  Pulse: 62  Resp: 18  Temp: 97.6 F (36.4 C)  SpO2: 98%   Vitals:   09/14/23 0944  Weight: 114 lb 12.8 oz (52.1 kg)  Height: 5' (1.524 m)   Body mass index is 22.42 kg/m. Ideal Body Weight: Weight in (lb) to have BMI = 25: 127.7  GEN: no acute distress.  Petite build, looks well HEENT: Atraumatic, Normocephalic.  Ears and Nose: No external deformity. CV: RRR, No M/G/R. No JVD. No thrill. No extra heart sounds. PULM: CTA B, no wheezes, crackles, rhonchi. No retractions. No resp. distress. No accessory muscle use. ABD: S, NT, ND, +BS. No rebound. No HSM.  Benign belly EXTR: No c/c/e PSYCH: Normally interactive. Conversant.  Pelvic: normal, no vaginal lesions or discharge. Uterus normal, no CMT, no adnexal tendereness or masses No blood visible in vaginal vault but there is a pink color on swab  Assessment and Plan: Post-menopausal bleeding - Plan: Cervicovaginal ancillary only( [place]), POCT urinalysis  dipstick, Urine Culture, CBC, TSH, Ambulatory referral to Obstetrics / Gynecology, Urine Microscopic  Screening, lipid - Plan: Lipid panel  Screening for diabetes mellitus - Plan: Comprehensive metabolic panel, Hemoglobin A1c  Patient seen today with postmenopausal bleeding.  Counseled her that this can be a warning sign of endometrial cancer, though it certainly may be benign.  Referral to GYN, lab evaluation pending as above.  I also spoke to her husband on the telephone at patient's request  Advised that I have placed referral to gynecology.  If her appointment will be delayed by more than a couple of weeks please let me know-in that case I can go ahead and order an ultrasound in the  meantime  Signed Abbe Amsterdam, MD  Received labs as below, message to patient  Results for orders placed or performed in visit on 09/14/23  CBC  Result Value Ref Range   WBC 5.0 4.0 - 10.5 K/uL   RBC 4.73 3.87 - 5.11 Mil/uL   Platelets 211.0 150.0 - 400.0 K/uL   Hemoglobin 14.5 12.0 - 15.0 g/dL   HCT 40.9 81.1 - 91.4 %   MCV 91.6 78.0 - 100.0 fl   MCHC 33.4 30.0 - 36.0 g/dL   RDW 78.2 95.6 - 21.3 %  Comprehensive metabolic panel  Result Value Ref Range   Sodium 142 135 - 145 mEq/L   Potassium 3.9 3.5 - 5.1 mEq/L   Chloride 106 96 - 112 mEq/L   CO2 28 19 - 32 mEq/L   Glucose, Bld 90 70 - 99 mg/dL   BUN 16 6 - 23 mg/dL   Creatinine, Ser 0.86 0.40 - 1.20 mg/dL   Total Bilirubin 0.4 0.2 - 1.2 mg/dL   Alkaline Phosphatase 57 39 - 117 U/L   AST 27 0 - 37 U/L   ALT 21 0 - 35 U/L   Total Protein 7.7 6.0 - 8.3 g/dL   Albumin 4.6 3.5 - 5.2 g/dL   GFR 57.84 >69.62 mL/min   Calcium 9.6 8.4 - 10.5 mg/dL  Hemoglobin X5M  Result Value Ref Range   Hgb A1c MFr Bld 5.4 4.6 - 6.5 %  Lipid panel  Result Value Ref Range   Cholesterol 263 (H) 0 - 200 mg/dL   Triglycerides 84.1 0.0 - 149.0 mg/dL   HDL 32.44 >01.02 mg/dL   VLDL 72.5 0.0 - 36.6 mg/dL   LDL Cholesterol 440 (H) 0 - 99 mg/dL   Total CHOL/HDL Ratio 4    NonHDL 189.32   TSH  Result Value Ref Range   TSH 2.44 0.35 - 5.50 uIU/mL  Urine Microscopic  Result Value Ref Range   WBC, UA 0-2/hpf 0-2/hpf   RBC / HPF 3-6/hpf (A) 0-2/hpf   Squamous Epithelial / HPF Few(5-10/hpf) (A) Rare(0-4/hpf)   Bacteria, UA Rare(<10/hpf) (A) None  POCT urinalysis dipstick  Result Value Ref Range   Color, UA yellow yellow   Clarity, UA cloudy (A) clear   Glucose, UA negative negative mg/dL   Bilirubin, UA negative negative   Ketones, POC UA negative negative mg/dL   Spec Grav, UA <=3.474 (A) 1.010 - 1.025   Blood, UA small (A) negative   pH, UA 6.0 5.0 - 8.0   Protein Ur, POC negative negative mg/dL   Urobilinogen, UA 0.2 0.2 or 1.0  E.U./dL   Nitrite, UA Negative Negative   Leukocytes, UA Negative Negative

## 2023-09-14 NOTE — Telephone Encounter (Signed)
Pt came in and stated that her husband called to make appointment with referral placed today. They are unable to see pt until next year. Pt was told to let us know if that happened so referral could be changed so they can be seen in the next week. Please call pt at [phone] and advise on next steps.

## 2023-09-14 NOTE — Telephone Encounter (Signed)
Per referral note:   "Patient husband called we offer his wife appointment with our new provider Merrilee Jansky who can see and treat his wife for the bleeding .Patient husband only wants her to see a doctor.Explain to him that Dr Sunday Corn next available is next year.Patient husband will call you all for another referral and we will now close this one. "   Please advise:

## 2023-09-15 LAB — CERVICOVAGINAL ANCILLARY ONLY
Bacterial Vaginitis (gardnerella): NEGATIVE
Candida Glabrata: NEGATIVE
Candida Vaginitis: NEGATIVE
Chlamydia: NEGATIVE
Comment: NEGATIVE
Comment: NEGATIVE
Comment: NEGATIVE
Comment: NEGATIVE
Comment: NEGATIVE
Comment: NORMAL
Neisseria Gonorrhea: NEGATIVE
Trichomonas: NEGATIVE

## 2023-09-15 LAB — URINE CULTURE
MICRO NUMBER:: 15712631
Result:: NO GROWTH
SPECIMEN QUALITY:: ADEQUATE

## 2023-09-22 ENCOUNTER — Encounter: Payer: Self-pay | Admitting: Family Medicine

## 2023-12-04 ENCOUNTER — Other Ambulatory Visit: Payer: Self-pay | Admitting: Family Medicine

## 2023-12-04 DIAGNOSIS — Z1231 Encounter for screening mammogram for malignant neoplasm of breast: Secondary | ICD-10-CM

## 2024-01-04 ENCOUNTER — Encounter: Payer: 59 | Admitting: Family Medicine

## 2024-01-19 ENCOUNTER — Ambulatory Visit
Admission: RE | Admit: 2024-01-19 | Discharge: 2024-01-19 | Disposition: A | Discharge status: Home / Self Care | Source: Ambulatory Visit | Attending: Family Medicine | Admitting: Family Medicine

## 2024-01-19 DIAGNOSIS — Z1231 Encounter for screening mammogram for malignant neoplasm of breast: Secondary | ICD-10-CM

## 2024-02-02 ENCOUNTER — Ambulatory Visit

## 2024-06-29 NOTE — Assessment & Plan Note (Signed)
 Patient encouraged to maintain heart healthy diet, regular exercise, adequate sleep. Consider daily probiotics. Take medications as prescribed. Labs ordered and reviewed.  High dose flu shot mid Sept to mid Oct Colonoscopy August 2021, repeat in 2028. Pap 2023 repeat every 3-5 years, [facility] 01/2024 repeat annually Given and reviewed copy of ACP documents from [facility] and encouraged to complete and return

## 2024-06-29 NOTE — Assessment & Plan Note (Signed)
 Encourage heart healthy diet such as MIND or DASH diet, increase exercise, avoid trans fats, simple carbohydrates and processed foods, consider a krill or fish or flaxseed oil cap daily.

## 2024-06-30 ENCOUNTER — Encounter: Payer: 59 | Admitting: Family Medicine

## 2024-06-30 ENCOUNTER — Ambulatory Visit: Payer: 59 | Admitting: Family Medicine

## 2024-06-30 ENCOUNTER — Encounter: Payer: Self-pay | Admitting: Family Medicine

## 2024-06-30 VITALS — BP 112/70 | HR 64 | Resp 16 | Ht 60.0 in | Wt 117.0 lb

## 2024-06-30 DIAGNOSIS — Z0000 Encounter for general adult medical examination without abnormal findings: Secondary | ICD-10-CM

## 2024-06-30 DIAGNOSIS — E785 Hyperlipidemia, unspecified: Secondary | ICD-10-CM

## 2024-06-30 LAB — CBC WITH DIFFERENTIAL/PLATELET
Basophils Absolute: 0 K/uL (ref 0.0–0.1)
Basophils Relative: 0.5 % (ref 0.0–3.0)
Eosinophils Absolute: 0.1 K/uL (ref 0.0–0.7)
Eosinophils Relative: 2.6 % (ref 0.0–5.0)
HCT: 44 % (ref 36.0–46.0)
Hemoglobin: 14.5 g/dL (ref 12.0–15.0)
Lymphocytes Relative: 41.4 % (ref 12.0–46.0)
Lymphs Abs: 2.3 K/uL (ref 0.7–4.0)
MCHC: 33.1 g/dL (ref 30.0–36.0)
MCV: 89.5 fl (ref 78.0–100.0)
Monocytes Absolute: 0.4 K/uL (ref 0.1–1.0)
Monocytes Relative: 7 % (ref 3.0–12.0)
Neutro Abs: 2.6 K/uL (ref 1.4–7.7)
Neutrophils Relative %: 48.5 % (ref 43.0–77.0)
Platelets: 196 K/uL (ref 150.0–400.0)
RBC: 4.92 Mil/uL (ref 3.87–5.11)
RDW: 13.7 % (ref 11.5–15.5)
WBC: 5.4 K/uL (ref 4.0–10.5)

## 2024-06-30 LAB — COMPREHENSIVE METABOLIC PANEL WITH GFR
ALT: 19 U/L (ref 0–35)
AST: 25 U/L (ref 0–37)
Albumin: 4.6 g/dL (ref 3.5–5.2)
Alkaline Phosphatase: 55 U/L (ref 39–117)
BUN: 15 mg/dL (ref 6–23)
CO2: 31 meq/L (ref 19–32)
Calcium: 9.4 mg/dL (ref 8.4–10.5)
Chloride: 101 meq/L (ref 96–112)
Creatinine, Ser: 0.74 mg/dL (ref 0.40–1.20)
GFR: 90.77 mL/min (ref >60.00)
Glucose, Bld: 84 mg/dL (ref 70–99)
Potassium: 4.3 meq/L (ref 3.5–5.1)
Sodium: 140 meq/L (ref 135–145)
Total Bilirubin: 0.5 mg/dL (ref 0.2–1.2)
Total Protein: 8 g/dL (ref 6.0–8.3)

## 2024-06-30 LAB — LIPID PANEL
Cholesterol: 244 mg/dL — ABNORMAL HIGH (ref 0–200)
HDL: 75.9 mg/dL (ref >39.00)
LDL Cholesterol: 156 mg/dL — ABNORMAL HIGH (ref 0–99)
NonHDL: 167.82
Total CHOL/HDL Ratio: 3
Triglycerides: 57 mg/dL (ref 0.0–149.0)
VLDL: 11.4 mg/dL (ref 0.0–40.0)

## 2024-06-30 LAB — TSH: TSH: 1.6 u[IU]/mL (ref 0.35–5.50)

## 2024-06-30 NOTE — Progress Notes (Signed)
 Subjective:    Patient ID: Susan Richard, female    DOB: 1966/03/09, 58 y.o.   MRN: [id number]  Chief Complaint  Patient presents with   Annual Exam    Patient presents today for a physical exam.   Quality Metric Gaps    HIV & Hep C screening,Hep B vaccines,pneumococcal vaccine    HPI Discussed the use of AI scribe software for clinical note transcription with the patient, who gave verbal consent to proceed.  History of Present Illness Susan Richard is a 58 year old female who presents for a routine check-up and preventive care.  She has no major illnesses or recent emergency room visits and is managing menopause symptoms well. She is up to date with her health screenings, having had a colonoscopy in 2021. Her last Pap smear was in 2023. She had a mammogram in March 2025.  She maintains a healthy lifestyle, ensuring adequate hydration, sleep, and physical activity. She stays active  and incorporates protein into her diet, consuming eggs daily and running for half an hour twice a day. She recently had an eye check-up and requires glasses for reading. She visits the dentist biannually.  She is up to date with her vaccinations, with the last tetanus shot in 2018.  No recent vision or hearing troubles, constipation, or urinary issues. No trouble swallowing or persistent cough. No c/o      CP/palp/SOB/HA/congestion/fevers/GI or GU c/o. Taking meds as prescribed     Past Medical History:  Diagnosis Date   Cervical cancer screening 03/31/2012   Chicken pox as a child   Hyperlipidemia, mixed 05/07/2015   Preventative health care 03/31/2012    Past Surgical History:  Procedure Laterality Date   CESAREAN SECTION  1995 and 1997   X 2   COLONOSCOPY  06/28/2020    Family History  Problem Relation Age of Onset   Colon cancer Neg Hx    Esophageal cancer Neg Hx    Rectal cancer Neg Hx    Stomach cancer Neg Hx     Social History   Socioeconomic History   Marital status: Married     Spouse name: Not on file   Number of children: Not on file   Years of education: Not on file   Highest education level: Not on file  Occupational History   Occupation: Hair stylist  Tobacco Use   Smoking status: Never   Smokeless tobacco: Never  Substance and Sexual Activity   Alcohol use: No   Drug use: No   Sexual activity: Not Currently  Other Topics Concern   Not on file  Social History Narrative   Not on file   Social Drivers of Health   Financial Resource Strain: Not on file  Food Insecurity: Not on file  Transportation Needs: Not on file  Physical Activity: Sufficiently Active (09/06/2018)   Exercise Vital Sign    Days of Exercise per Week: 4 days    Minutes of Exercise per Session: 40 min  Stress: No Stress Concern Present (09/06/2018)   [employer] of Occupational Health - Occupational Stress Questionnaire    Feeling of Stress : Only a little  Social Connections: Moderately Integrated (09/06/2018)   Social Connection and Isolation Panel    Frequency of Communication with Friends and Family: More than three times a week    Frequency of Social Gatherings with Friends and Family: More than three times a week    Attends Religious Services: 1 to 4 times per  year    Active Member of Clubs or Organizations: No    Attends Banker Meetings: Never    Marital Status: Married  Catering manager Violence: Not At Risk (09/06/2018)   Humiliation, Afraid, Rape, and Kick questionnaire    Fear of Current or Ex-Partner: No    Emotionally Abused: No    Physically Abused: No    Sexually Abused: No    No outpatient medications prior to visit.   No facility-administered medications prior to visit.    No Known Allergies  Review of Systems  Constitutional:  Negative for chills, fever and malaise/fatigue.  HENT:  Negative for congestion and hearing loss.   Eyes:  Negative for discharge.  Respiratory:  Negative for cough, sputum production and shortness of  breath.   Cardiovascular:  Negative for chest pain, palpitations and leg swelling.  Gastrointestinal:  Negative for abdominal pain, blood in stool, constipation, diarrhea, heartburn, nausea and vomiting.  Genitourinary:  Negative for dysuria, frequency, hematuria and urgency.  Musculoskeletal:  Negative for back pain, falls and myalgias.  Skin:  Negative for rash.  Neurological:  Negative for dizziness, sensory change, loss of consciousness, weakness and headaches.  Endo/Heme/Allergies:  Negative for environmental allergies. Does not bruise/bleed easily.  Psychiatric/Behavioral:  Negative for depression and suicidal ideas. The patient is not nervous/anxious and does not have insomnia.        Objective:    Physical Exam Constitutional:      General: She is not in acute distress.    Appearance: Normal appearance. She is not diaphoretic.  HENT:     Head: Normocephalic and atraumatic.     Right Ear: Tympanic membrane, ear canal and external ear normal.     Left Ear: Tympanic membrane, ear canal and external ear normal.     Nose: Nose normal.     Mouth/Throat:     Mouth: Mucous membranes are moist.     Pharynx: Oropharynx is clear. No oropharyngeal exudate.  Eyes:     General: No scleral icterus.       Right eye: No discharge.        Left eye: No discharge.     Conjunctiva/sclera: Conjunctivae normal.     Pupils: Pupils are equal, round, and reactive to light.  Neck:     Thyroid : No thyromegaly.  Cardiovascular:     Rate and Rhythm: Normal rate and regular rhythm.     Heart sounds: Normal heart sounds. No murmur heard. Pulmonary:     Effort: Pulmonary effort is normal. No respiratory distress.     Breath sounds: Normal breath sounds. No wheezing or rales.  Abdominal:     General: Bowel sounds are normal. There is no distension.     Palpations: Abdomen is soft. There is no mass.     Tenderness: There is no abdominal tenderness.  Musculoskeletal:        General: No tenderness.  Normal range of motion.     Cervical back: Normal range of motion and neck supple.  Lymphadenopathy:     Cervical: No cervical adenopathy.  Skin:    General: Skin is warm and dry.     Findings: No rash.  Neurological:     General: No focal deficit present.     Mental Status: She is alert and oriented to person, place, and time.     Cranial Nerves: No cranial nerve deficit.     Coordination: Coordination normal.     Deep Tendon Reflexes: Reflexes are normal and  symmetric. Reflexes normal.  Psychiatric:        Mood and Affect: Mood normal.        Behavior: Behavior normal.        Thought Content: Thought content normal.        Judgment: Judgment normal.     BP 112/70   Pulse 64   Resp 16   Ht 5' (1.524 m)   Wt 117 lb (53.1 kg)   LMP 06/05/2020   SpO2 98%   BMI 22.85 kg/m  Wt Readings from Last 3 Encounters:  06/30/24 117 lb (53.1 kg)  09/14/23 114 lb 12.8 oz (52.1 kg)  07/10/22 116 lb 3.2 oz (52.7 kg)    Diabetic Foot Exam - Simple   No data filed    Lab Results  Component Value Date   WBC 5.0 09/14/2023   HGB 14.5 09/14/2023   HCT 43.3 09/14/2023   PLT 211.0 09/14/2023   GLUCOSE 90 09/14/2023   CHOL 263 (H) 09/14/2023   TRIG 99.0 09/14/2023   HDL 73.80 09/14/2023   LDLCALC 170 (H) 09/14/2023   ALT 21 09/14/2023   AST 27 09/14/2023   NA 142 09/14/2023   K 3.9 09/14/2023   CL 106 09/14/2023   CREATININE 0.73 09/14/2023   BUN 16 09/14/2023   CO2 28 09/14/2023   TSH 2.44 09/14/2023   HGBA1C 5.4 09/14/2023    Lab Results  Component Value Date   TSH 2.44 09/14/2023   Lab Results  Component Value Date   WBC 5.0 09/14/2023   HGB 14.5 09/14/2023   HCT 43.3 09/14/2023   MCV 91.6 09/14/2023   PLT 211.0 09/14/2023   Lab Results  Component Value Date   NA 142 09/14/2023   K 3.9 09/14/2023   CO2 28 09/14/2023   GLUCOSE 90 09/14/2023   BUN 16 09/14/2023   CREATININE 0.73 09/14/2023   BILITOT 0.4 09/14/2023   ALKPHOS 57 09/14/2023   AST 27  09/14/2023   ALT 21 09/14/2023   PROT 7.7 09/14/2023   ALBUMIN 4.6 09/14/2023   CALCIUM 9.6 09/14/2023   GFR 92.78 09/14/2023   Lab Results  Component Value Date   CHOL 263 (H) 09/14/2023   Lab Results  Component Value Date   HDL 73.80 09/14/2023   Lab Results  Component Value Date   LDLCALC 170 (H) 09/14/2023   Lab Results  Component Value Date   TRIG 99.0 09/14/2023   Lab Results  Component Value Date   CHOLHDL 4 09/14/2023   Lab Results  Component Value Date   HGBA1C 5.4 09/14/2023       Assessment & Plan:  Hyperlipidemia, unspecified hyperlipidemia type Assessment & Plan: Encourage heart healthy diet such as MIND or DASH diet, increase exercise, avoid trans fats, simple carbohydrates and processed foods, consider a krill or fish or flaxseed oil cap daily.   Orders: -     Comprehensive metabolic panel with GFR -     CBC with Differential/Platelet -     TSH -     Lipid panel  Preventative health care Assessment & Plan: Patient encouraged to maintain heart healthy diet, regular exercise, adequate sleep. Consider daily probiotics. Take medications as prescribed. Labs ordered and reviewed.  High dose flu shot mid Sept to mid Oct Colonoscopy August 2021, repeat in 2028. Pap 2023 repeat every 3-5 years, [facility] 01/2024 repeat annually Given and reviewed copy of ACP documents from [facility] and encouraged to complete and return   Orders: -  Comprehensive metabolic panel with GFR -     CBC with Differential/Platelet -     TSH -     Lipid panel    Assessment and Plan Assessment & Plan Adult Wellness Visit Routine wellness visit with no major concerns. Discussed menopause management, blood work, cervical cancer screening, mammogram schedule, bone density screening, and advanced directives. Emphasized protein intake and regular physical activity for muscle mass and overall health. Discussed flu and COVID boosters. - Order blood work for kidney, liver,  anemia, thyroid , cholesterol, and glucose levels. - Schedule Pap smear for 2026 or later based on preference. - Schedule mammogram between June and August 2026. - Plan bone density screening at age 37. - Provide advanced directives packet for review. - Encourage protein intake with every meal and snack. - Recommend annual flu and COVID boosters before cold and flu season.  Hyperlipidemia Specific recommendations regarding diet and activity were discussed during this visit.   History of colonic polyp Colonoscopy last performed in 2021, next due in 2028.  Goals of Care Discussed advanced directives and healthcare power of attorney. Emphasized the need for a Engineer, materials and backup for medical decisions in case of incapacity. Discussed potential scenarios and preferences regarding life-sustaining treatments, such as intubation and long-term mechanical ventilation. - Encourage completion of advanced directives and healthcare power of attorney documents. - Discuss preferences with designated decision-maker and backup.  Recording duration: 23 minutes     Harlene Horton, MD

## 2024-06-30 NOTE — Patient Instructions (Addendum)
 Flu and Covid booster annuallyPreventive Care 75-56 Years Old, Female Preventive care refers to lifestyle choices and visits with your health care provider that can promote health and wellness. Preventive care visits are also called wellness exams. What can I expect for my preventive care visit? Counseling Your health care provider may ask you questions about your: Medical history, including: Past medical problems. Family medical history. Pregnancy history. Current health, including: Menstrual cycle. Method of birth control. Emotional well-being. Home life and relationship well-being. Sexual activity and sexual health. Lifestyle, including: Alcohol, nicotine or tobacco, and drug use. Access to firearms. Diet, exercise, and sleep habits. Work and work Astronomer. Sunscreen use. Safety issues such as seatbelt and bike helmet use. Physical exam Your health care provider will check your: Height and weight. These may be used to calculate your BMI (body mass index). BMI is a measurement that tells if you are at a healthy weight. Waist circumference. This measures the distance around your waistline. This measurement also tells if you are at a healthy weight and may help predict your risk of certain diseases, such as type 2 diabetes and high blood pressure. Heart rate and blood pressure. Body temperature. Skin for abnormal spots. What immunizations do I need?  Vaccines are usually given at various ages, according to a schedule. Your health care provider will recommend vaccines for you based on your age, medical history, and lifestyle or other factors, such as travel or where you work. What tests do I need? Screening Your health care provider may recommend screening tests for certain conditions. This may include: Lipid and cholesterol levels. Diabetes screening. This is done by checking your blood sugar (glucose) after you have not eaten for a while (fasting). Pelvic exam and Pap  test. Hepatitis B test. Hepatitis C test. HIV (human immunodeficiency virus) test. STI (sexually transmitted infection) testing, if you are at risk. Lung cancer screening. Colorectal cancer screening. Mammogram. Talk with your health care provider about when you should start having regular mammograms. This may depend on whether you have a family history of breast cancer. BRCA-related cancer screening. This may be done if you have a family history of breast, ovarian, tubal, or peritoneal cancers. Bone density scan. This is done to screen for osteoporosis. Talk with your health care provider about your test results, treatment options, and if necessary, the need for more tests. Follow these instructions at home: Eating and drinking  Eat a diet that includes fresh fruits and vegetables, whole grains, lean protein, and low-fat dairy products. Take vitamin and mineral supplements as recommended by your health care provider. Do not drink alcohol if: Your health care provider tells you not to drink. You are pregnant, may be pregnant, or are planning to become pregnant. If you drink alcohol: Limit how much you have to 0-1 drink a day. Know how much alcohol is in your drink. In the [place] one drink equals one 12 oz bottle of beer (355 mL), one 5 oz glass of wine (148 mL), or one 1 oz glass of hard liquor (44 mL). Lifestyle Brush your teeth every morning and night with fluoride toothpaste. Floss one time each day. Exercise for at least 30 minutes 5 or more days each week. Do not use any products that contain nicotine or tobacco. These products include cigarettes, chewing tobacco, and vaping devices, such as e-cigarettes. If you need help quitting, ask your health care provider. Do not use drugs. If you are sexually active, practice safe sex. Use a condom or other  form of protection to prevent STIs. If you do not wish to become pregnant, use a form of birth control. If you plan to become pregnant,  see your health care provider for a prepregnancy visit. Take aspirin only as told by your health care provider. Make sure that you understand how much to take and what form to take. Work with your health care provider to find out whether it is safe and beneficial for you to take aspirin daily. Find healthy ways to manage stress, such as: Meditation, yoga, or listening to music. Journaling. Talking to a trusted person. Spending time with friends and family. Minimize exposure to UV radiation to reduce your risk of skin cancer. Safety Always wear your seat belt while driving or riding in a vehicle. Do not drive: If you have been drinking alcohol. Do not ride with someone who has been drinking. When you are tired or distracted. While texting. If you have been using any mind-altering substances or drugs. Wear a helmet and other protective equipment during sports activities. If you have firearms in your house, make sure you follow all gun safety procedures. Seek help if you have been physically or sexually abused. What's next? Visit your health care provider once a year for an annual wellness visit. Ask your health care provider how often you should have your eyes and teeth checked. Stay up to date on all vaccines. This information is not intended to replace advice given to you by your health care provider. Make sure you discuss any questions you have with your health care provider. Document Revised: 04/17/2021 Document Reviewed: 04/17/2021 Elsevier Patient Education  2024 [employer].

## 2024-07-01 ENCOUNTER — Ambulatory Visit: Payer: Self-pay | Admitting: Family Medicine

## 2025-07-03 ENCOUNTER — Encounter: Admitting: Family Medicine
